# Patient Record
Sex: Female | Born: 1955 | Race: White | Hispanic: No | Marital: Single | Smoking: Never smoker
Health system: Southern US, Community
[De-identification: ages and names within clinical notes are randomized; demographics above are authoritative.]

## PROBLEM LIST (undated history)

## (undated) DIAGNOSIS — Z8041 Family history of malignant neoplasm of ovary: Secondary | ICD-10-CM

## (undated) DIAGNOSIS — E785 Hyperlipidemia, unspecified: Secondary | ICD-10-CM

## (undated) DIAGNOSIS — L409 Psoriasis, unspecified: Secondary | ICD-10-CM

## (undated) DIAGNOSIS — Z808 Family history of malignant neoplasm of other organs or systems: Secondary | ICD-10-CM

## (undated) DIAGNOSIS — Z8042 Family history of malignant neoplasm of prostate: Secondary | ICD-10-CM

## (undated) DIAGNOSIS — Z8051 Family history of malignant neoplasm of kidney: Secondary | ICD-10-CM

## (undated) DIAGNOSIS — I10 Essential (primary) hypertension: Secondary | ICD-10-CM

## (undated) HISTORY — DX: Hyperlipidemia, unspecified: E78.5

## (undated) HISTORY — DX: Family history of malignant neoplasm of other organs or systems: Z80.8

## (undated) HISTORY — DX: Family history of malignant neoplasm of prostate: Z80.42

## (undated) HISTORY — DX: Family history of malignant neoplasm of ovary: Z80.41

## (undated) HISTORY — DX: Family history of malignant neoplasm of kidney: Z80.51

## (undated) HISTORY — PX: BREAST CYST ASPIRATION: SHX578

## (undated) HISTORY — PX: TONSILLECTOMY AND ADENOIDECTOMY: SUR1326

## (undated) HISTORY — DX: Psoriasis, unspecified: L40.9

## (undated) HISTORY — PX: ABDOMINAL HYSTERECTOMY: SHX81

---
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
## 2014-01-18 ENCOUNTER — Ambulatory Visit: Payer: Self-pay | Admitting: Gastroenterology

## 2014-01-29 ENCOUNTER — Ambulatory Visit: Payer: Self-pay | Admitting: Family Medicine

## 2014-02-12 ENCOUNTER — Ambulatory Visit: Payer: Self-pay | Admitting: Family Medicine

## 2014-05-14 LAB — SURGICAL PATHOLOGY

## 2014-07-03 ENCOUNTER — Other Ambulatory Visit: Payer: Self-pay | Admitting: Family Medicine

## 2014-07-03 DIAGNOSIS — N63 Unspecified lump in unspecified breast: Secondary | ICD-10-CM

## 2014-08-13 ENCOUNTER — Ambulatory Visit
Admission: RE | Admit: 2014-08-13 | Discharge: 2014-08-13 | Disposition: A | Discharge status: Home / Self Care | Payer: Managed Care, Other (non HMO) | Source: Ambulatory Visit | Attending: Family Medicine | Admitting: Family Medicine

## 2014-08-13 ENCOUNTER — Other Ambulatory Visit: Payer: Self-pay | Admitting: Family Medicine

## 2014-08-13 DIAGNOSIS — N63 Unspecified lump in unspecified breast: Secondary | ICD-10-CM

## 2014-08-13 DIAGNOSIS — Z09 Encounter for follow-up examination after completed treatment for conditions other than malignant neoplasm: Secondary | ICD-10-CM | POA: Diagnosis present

## 2015-01-09 ENCOUNTER — Other Ambulatory Visit: Payer: Self-pay | Admitting: Family Medicine

## 2015-01-09 DIAGNOSIS — N63 Unspecified lump in unspecified breast: Secondary | ICD-10-CM

## 2015-01-31 ENCOUNTER — Ambulatory Visit
Admission: RE | Admit: 2015-01-31 | Discharge: 2015-01-31 | Disposition: A | Discharge status: Home / Self Care | Payer: Managed Care, Other (non HMO) | Source: Ambulatory Visit | Attending: Family Medicine | Admitting: Family Medicine

## 2015-01-31 DIAGNOSIS — N63 Unspecified lump in unspecified breast: Secondary | ICD-10-CM

## 2015-12-30 ENCOUNTER — Other Ambulatory Visit: Payer: Self-pay | Admitting: Family Medicine

## 2015-12-30 DIAGNOSIS — N63 Unspecified lump in unspecified breast: Secondary | ICD-10-CM

## 2015-12-30 DIAGNOSIS — R928 Other abnormal and inconclusive findings on diagnostic imaging of breast: Secondary | ICD-10-CM

## 2016-02-03 ENCOUNTER — Ambulatory Visit
Admission: RE | Admit: 2016-02-03 | Discharge: 2016-02-03 | Disposition: A | Discharge status: Home / Self Care | Payer: Managed Care, Other (non HMO) | Source: Ambulatory Visit | Attending: Family Medicine | Admitting: Family Medicine

## 2016-02-03 DIAGNOSIS — N63 Unspecified lump in unspecified breast: Secondary | ICD-10-CM

## 2016-02-03 DIAGNOSIS — R928 Other abnormal and inconclusive findings on diagnostic imaging of breast: Secondary | ICD-10-CM

## 2016-02-03 DIAGNOSIS — N6321 Unspecified lump in the left breast, upper outer quadrant: Secondary | ICD-10-CM | POA: Insufficient documentation

## 2016-12-29 ENCOUNTER — Other Ambulatory Visit: Payer: Self-pay | Admitting: Family Medicine

## 2016-12-29 DIAGNOSIS — Z1231 Encounter for screening mammogram for malignant neoplasm of breast: Secondary | ICD-10-CM

## 2017-02-03 ENCOUNTER — Ambulatory Visit
Admission: RE | Admit: 2017-02-03 | Discharge: 2017-02-03 | Disposition: A | Discharge status: Home / Self Care | Payer: Managed Care, Other (non HMO) | Source: Ambulatory Visit | Attending: Family Medicine | Admitting: Family Medicine

## 2017-02-03 DIAGNOSIS — Z1231 Encounter for screening mammogram for malignant neoplasm of breast: Secondary | ICD-10-CM | POA: Diagnosis not present

## 2017-02-11 ENCOUNTER — Other Ambulatory Visit: Payer: Self-pay | Admitting: Family Medicine

## 2017-02-11 DIAGNOSIS — N6320 Unspecified lump in the left breast, unspecified quadrant: Secondary | ICD-10-CM

## 2017-02-11 DIAGNOSIS — R928 Other abnormal and inconclusive findings on diagnostic imaging of breast: Secondary | ICD-10-CM

## 2017-02-18 ENCOUNTER — Ambulatory Visit
Admission: RE | Admit: 2017-02-18 | Discharge: 2017-02-18 | Disposition: A | Discharge status: Home / Self Care | Payer: Managed Care, Other (non HMO) | Source: Ambulatory Visit | Attending: Family Medicine | Admitting: Family Medicine

## 2017-02-18 DIAGNOSIS — R928 Other abnormal and inconclusive findings on diagnostic imaging of breast: Secondary | ICD-10-CM | POA: Diagnosis present

## 2017-02-18 DIAGNOSIS — N6320 Unspecified lump in the left breast, unspecified quadrant: Secondary | ICD-10-CM

## 2018-01-26 ENCOUNTER — Other Ambulatory Visit: Payer: Self-pay | Admitting: Family Medicine

## 2018-01-26 DIAGNOSIS — Z1231 Encounter for screening mammogram for malignant neoplasm of breast: Secondary | ICD-10-CM

## 2018-02-15 ENCOUNTER — Ambulatory Visit
Admission: RE | Admit: 2018-02-15 | Discharge: 2018-02-15 | Disposition: A | Discharge status: Home / Self Care | Payer: Managed Care, Other (non HMO) | Source: Ambulatory Visit | Attending: Family Medicine | Admitting: Family Medicine

## 2018-02-15 DIAGNOSIS — Z1231 Encounter for screening mammogram for malignant neoplasm of breast: Secondary | ICD-10-CM | POA: Diagnosis not present

## 2018-09-02 ENCOUNTER — Encounter: Payer: Self-pay | Admitting: Family Medicine

## 2018-09-02 ENCOUNTER — Other Ambulatory Visit: Payer: Self-pay

## 2018-09-02 ENCOUNTER — Ambulatory Visit (INDEPENDENT_AMBULATORY_CARE_PROVIDER_SITE_OTHER): Payer: PRIVATE HEALTH INSURANCE | Admitting: Family Medicine

## 2018-09-02 VITALS — BP 149/92 | HR 69 | Temp 98.5°F | Ht 60.5 in | Wt 151.0 lb

## 2018-09-02 DIAGNOSIS — M0800 Unspecified juvenile rheumatoid arthritis of unspecified site: Secondary | ICD-10-CM

## 2018-09-02 DIAGNOSIS — Z7689 Persons encountering health services in other specified circumstances: Secondary | ICD-10-CM | POA: Diagnosis not present

## 2018-09-02 DIAGNOSIS — Z8719 Personal history of other diseases of the digestive system: Secondary | ICD-10-CM | POA: Diagnosis not present

## 2018-09-02 DIAGNOSIS — L409 Psoriasis, unspecified: Secondary | ICD-10-CM

## 2018-09-02 MED ORDER — DICLOFENAC SODIUM 1 % TD GEL: 2.0000 g | Freq: Four times a day (QID) | TRANSDERMAL | 1 refills | Status: DC

## 2018-09-02 NOTE — Progress Notes (Signed)
BP (!) 149/92   Pulse 69   Temp 98.5 F (36.9 C) (Oral)   Ht 5' 0.5" (1.537 m)   Wt 151 lb (68.5 kg)   SpO2 99%   BMI 29.00 kg/m    Subjective:    Patient ID: Cynthia Nelson, female    DOB: 1955/11/09, 63 y.o.   MRN: [id number]  HPI: Cynthia Nelson is a 63 y.o. female  Chief Complaint  Patient presents with  . Establish Care    no concerns   Patient presents today to establish care. No concerns today.   Last CPE was back in January 2020.   Followed by Dermatology for psoriasis, currently under good control with otezla, clobetasol, and fluocinolone scalp oil prn.   Hx of juvenile RA, has been dealing with the joint pains (worst in hands) her entire life and notes they are tolerable for her and not worsening. Takes tylenol prn. Cannot take NSAIDs due to hx of duodenal ulcer. Has never seen Rheumatologist for this per patient. Denies joint redness, but does have occasional swelling.    Relevant past medical, surgical, family and social history reviewed and updated as indicated. Interim medical history since our last visit reviewed. Allergies and medications reviewed and updated.  Review of Systems  Per HPI unless specifically indicated above     Objective:    BP (!) 149/92   Pulse 69   Temp 98.5 F (36.9 C) (Oral)   Ht 5' 0.5" (1.537 m)   Wt 151 lb (68.5 kg)   SpO2 99%   BMI 29.00 kg/m   Wt Readings from Last 3 Encounters:  09/02/18 151 lb (68.5 kg)    Physical Exam Vitals signs and nursing note reviewed.  Constitutional:      Appearance: Normal appearance. She is not ill-appearing.  HENT:     Head: Atraumatic.  Eyes:     Extraocular Movements: Extraocular movements intact.     Conjunctiva/sclera: Conjunctivae normal.  Neck:     Musculoskeletal: Normal range of motion and neck supple.  Cardiovascular:     Rate and Rhythm: Normal rate and regular rhythm.     Heart sounds: Normal heart sounds.  Pulmonary:     Effort: Pulmonary effort is normal.   Breath sounds: Normal breath sounds.  Musculoskeletal: Normal range of motion.        General: No deformity.  Skin:    General: Skin is warm and dry.  Neurological:     Mental Status: She is alert and oriented to person, place, and time.  Psychiatric:        Mood and Affect: Mood normal.        Thought Content: Thought content normal.        Judgment: Judgment normal.     No results found for this or any previous visit.    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 Declines Rheumatology referral today as the pain is tolerable and unchanged from lifelong course. WIll start diclofenac gel prn for comfort in addition to prn tylenol.       Relevant Medications   Apremilast (OTEZLA) 30 MG TABS   Psoriasis - Primary    Followed by Dermatology, under good control. Continue current regimen per their recommendation        Other   History of duodenal ulcer  No current sxs. Continue avoidance of NSAIDs and call with GI issues as reviewed       Other Visit Diagnoses    Encounter to establish care           Follow up plan: Return in about 5 months (around 02/02/2019) for CPE.

## 2018-09-09 DIAGNOSIS — L409 Psoriasis, unspecified: Secondary | ICD-10-CM | POA: Insufficient documentation

## 2018-09-09 NOTE — Assessment & Plan Note (Signed)
Followed by Dermatology, under good control. Continue current regimen per their recommendation

## 2018-09-09 NOTE — Assessment & Plan Note (Signed)
With stable b/l joint pains, worst in hands per patient. Declines Rheumatology referral today as the pain is tolerable and unchanged from lifelong course. WIll start diclofenac gel prn for comfort in addition to prn tylenol.

## 2018-09-09 NOTE — Assessment & Plan Note (Signed)
No current sxs. Continue avoidance of NSAIDs and call with GI issues as reviewed

## 2019-02-01 ENCOUNTER — Encounter: Payer: PRIVATE HEALTH INSURANCE | Admitting: Family Medicine

## 2019-04-11 ENCOUNTER — Other Ambulatory Visit: Payer: Self-pay | Admitting: Dermatology

## 2019-04-25 ENCOUNTER — Telehealth: Payer: Self-pay | Admitting: Family Medicine

## 2019-04-25 DIAGNOSIS — Z1231 Encounter for screening mammogram for malignant neoplasm of breast: Secondary | ICD-10-CM

## 2019-04-25 NOTE — Telephone Encounter (Signed)
Copied from [place] [id number]. Topic: General - Inquiry >> Apr 25, 2019  1:07 PM Alease Frame wrote: Reason for CRM: pt is requesting a referral for a mammogram. Please advise

## 2019-04-27 NOTE — Telephone Encounter (Signed)
Order placed

## 2019-04-27 NOTE — Addendum Note (Signed)
Addended by: Merrie Roof E on: 04/27/2019 02:04 PM   Modules accepted: Orders

## 2019-04-27 NOTE — Telephone Encounter (Signed)
Pt has an appt scheduled for tomorrow.  

## 2019-04-28 ENCOUNTER — Encounter: Payer: PRIVATE HEALTH INSURANCE | Admitting: Family Medicine

## 2019-04-28 NOTE — Telephone Encounter (Signed)
Patient called to inquire about referral for a mammogram to [facility] but was informed that per yesterdays message order was placed.

## 2019-05-05 ENCOUNTER — Other Ambulatory Visit: Payer: Self-pay | Admitting: Family Medicine

## 2019-05-05 ENCOUNTER — Telehealth: Payer: Self-pay | Admitting: Family Medicine

## 2019-05-05 NOTE — Telephone Encounter (Signed)
Needs appt to perform in person exam and order appropriate imaging. She is overdue for CPE and this would be an appropriate next step to get this sorted

## 2019-05-05 NOTE — Telephone Encounter (Signed)
Order shows cancelled due to PT HAS LUMO IN BREAST, NEEDS DIAG BILAT MAMMO AND RT/LT BR LTD [place] ORDERS. Please advise.

## 2019-05-05 NOTE — Telephone Encounter (Signed)
Contacted Cynthia Nelson regarding mammogram order and was informed that patient told the scheduler at [facility] that she had a mass in her breast and needed a mammogram.  Pt told the scheduler that her provider was aware and that she was advised to call to schedule an appointment.  The scheduler at [facility] cancelled the order and instructed pt to contact pcp regarding mass and to request a diagnostic mammogram.  Contacted pt to explain why provider ordered a screening mammogram and explained that she must inform pcp if she is has a new issue. Pt stated that she just wanted a mammogram and didn't care what kind of mammogram it was.  Pt states she is having problems with her left breast and that it started a couple of weeks ago.  Pt states that she has cysts sometimes and also has dense breast and that the mass comes and goes.  Please advise next steps.

## 2019-05-05 NOTE — Telephone Encounter (Signed)
Patient called back to say that [facility] did not receive the referral and she can not schedule an appointment until they get the referral. Please advise.Marland KitchenMarland KitchenPatient can be reached at Ph# [phone]

## 2019-05-05 NOTE — Telephone Encounter (Signed)
Message from last week 4/9 with patient calling stating [facility] didn't get the order was never routed to me so I was unaware of this. Message from today received that I just needed to change the order I had placed to diagnostic, but per my record review she's had normal SCREENING mammograms in the past with most recent one being 01/2018 recommending screening mammogram in one year. Pt was established 08/2018 and had no breast concerns. When she called to request mammogram order a screening mammogram order was appropriately placed and subsequently cancelled without my knowledge by [facility]. Unclear why they are requesting orders to be changed to diagnostic based on my clinical knowledge of this patient. Please advise.

## 2019-05-05 NOTE — Telephone Encounter (Signed)
Patient scheduled for CPE on Monday, May 10th at [facility].  Patient unable to make appointment sooner due to training for new job from 9am-6pm.

## 2019-05-10 NOTE — Telephone Encounter (Signed)
Pt scheduled for appt on May 29, 2019.

## 2019-05-29 ENCOUNTER — Other Ambulatory Visit: Payer: Self-pay | Admitting: Family Medicine

## 2019-05-29 ENCOUNTER — Ambulatory Visit
Admission: RE | Admit: 2019-05-29 | Discharge: 2019-05-29 | Disposition: A | Discharge status: Home / Self Care | Payer: No Typology Code available for payment source | Source: Ambulatory Visit | Attending: Family Medicine | Admitting: Family Medicine

## 2019-05-29 ENCOUNTER — Ambulatory Visit (INDEPENDENT_AMBULATORY_CARE_PROVIDER_SITE_OTHER): Payer: No Typology Code available for payment source | Admitting: Family Medicine

## 2019-05-29 ENCOUNTER — Encounter: Payer: Self-pay | Admitting: Family Medicine

## 2019-05-29 ENCOUNTER — Ambulatory Visit (INDEPENDENT_AMBULATORY_CARE_PROVIDER_SITE_OTHER): Payer: No Typology Code available for payment source | Admitting: Dermatology

## 2019-05-29 ENCOUNTER — Other Ambulatory Visit: Payer: Self-pay

## 2019-05-29 VITALS — BP 148/110 | HR 94 | Temp 97.9°F | Ht 61.46 in | Wt 148.5 lb

## 2019-05-29 DIAGNOSIS — N6320 Unspecified lump in the left breast, unspecified quadrant: Secondary | ICD-10-CM

## 2019-05-29 DIAGNOSIS — N6321 Unspecified lump in the left breast, upper outer quadrant: Secondary | ICD-10-CM

## 2019-05-29 DIAGNOSIS — M0800 Unspecified juvenile rheumatoid arthritis of unspecified site: Secondary | ICD-10-CM | POA: Diagnosis not present

## 2019-05-29 DIAGNOSIS — Z0000 Encounter for general adult medical examination without abnormal findings: Secondary | ICD-10-CM

## 2019-05-29 DIAGNOSIS — I10 Essential (primary) hypertension: Secondary | ICD-10-CM

## 2019-05-29 DIAGNOSIS — Z1159 Encounter for screening for other viral diseases: Secondary | ICD-10-CM

## 2019-05-29 DIAGNOSIS — M08041 Unspecified juvenile rheumatoid arthritis, right hand: Secondary | ICD-10-CM

## 2019-05-29 DIAGNOSIS — Z136 Encounter for screening for cardiovascular disorders: Secondary | ICD-10-CM

## 2019-05-29 DIAGNOSIS — L409 Psoriasis, unspecified: Secondary | ICD-10-CM | POA: Diagnosis not present

## 2019-05-29 DIAGNOSIS — Z114 Encounter for screening for human immunodeficiency virus [HIV]: Secondary | ICD-10-CM | POA: Diagnosis not present

## 2019-05-29 DIAGNOSIS — M08042 Unspecified juvenile rheumatoid arthritis, left hand: Secondary | ICD-10-CM

## 2019-05-29 DIAGNOSIS — R928 Other abnormal and inconclusive findings on diagnostic imaging of breast: Secondary | ICD-10-CM

## 2019-05-29 NOTE — Assessment & Plan Note (Signed)
Stable and WNL, continue current regimen 

## 2019-05-29 NOTE — Assessment & Plan Note (Signed)
Stable and well controlled, continue current regimen 

## 2019-05-29 NOTE — Progress Notes (Signed)
   Follow-Up Visit   Subjective  Cynthia Nelson is a 64 y.o. female who presents for the following: Psoriasis (of legs and scalp - Otezla 30mg  bid. No side effects from [place].).  History of juvenile rheumatoid arthritis diagnosed as a child.  The [name] has helped her joints also.  The following portions of the chart were reviewed this encounter and updated as appropriate:  Tobacco  Allergies  Meds  Problems  Med Hx  Surg Hx  Fam Hx      Review of Systems:  No other skin or systemic complaints except as noted in HPI or Assessment and Plan.  Objective  Well appearing patient in no apparent distress; mood and affect are within normal limits.  A focused examination was performed including arms, legs, scalp. Relevant physical exam findings are noted in the Assessment and Plan.  Objective  Scalp and legs: Lower legs and knees are clear. Slight scale of left palm. Occipital scalp is clear.  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  Psoriasis severe on Otezla well controlled with minimal persistent areas. Scalp and legs  Continue Otezla 30mg  1 po bid. Patient assistance paperwork given to [place] today.  May restart Fluocinonide and Temovate if needed for flares.  Return in about 6 months (around 11/29/2019).  I, Ashok Cordia, CMA, am acting as scribe for Sarina Ser, MD .  Documentation: I have reviewed the above documentation for accuracy and completeness, and I agree with the above.  Sarina Ser, MD

## 2019-05-29 NOTE — Assessment & Plan Note (Signed)
Elevated consistently currently but wishes to avoid medication at this point and instead increase exercise and work on stress control as she feels very stressed currently. Continue home monitoring, call if readings don't improve

## 2019-05-29 NOTE — Progress Notes (Signed)
BP (!) 148/110   Pulse 94   Temp 97.9 F (36.6 C)   Ht 5' 1.46" (1.561 m)   Wt 148 lb 8 oz (67.4 kg)   SpO2 97%   BMI 27.64 kg/m    Subjective:    Patient ID: Cynthia Nelson, female    DOB: 1956-01-10, 64 y.o.   MRN: [id number]  HPI: Cynthia Nelson is a 64 y.o. female presenting on 05/29/2019 for comprehensive medical examination. Current medical complaints include:see below  Left breast lump around 1-2 o clock, has hx of cysts in breasts always benign in past. Denies noted weight loss, fhx of breast cancer, night sweats, fatigue, nipple discharge.   HTN - under good control when she's exercising consistently but since January had not been. Wanting to avoid medication. Home readings have been 140s-150s/90s range recently. Has started back walking about 3 miles per day now that it's warming up outside. Denies CP, SOB, HAs, dizziness.   She currently lives with: Menopausal Symptoms: no  Depression Screen done today and results listed below:  Depression screen [facility] 2/9 05/29/2019 09/02/2018  Decreased Interest 0 0  Down, Depressed, Hopeless 0 0  PHQ - 2 Score 0 0  Altered sleeping - 0  Tired, decreased energy - 0  Change in appetite - 0  Feeling bad or failure about yourself  - 0  Trouble concentrating - 0  Moving slowly or fidgety/restless - 0  Suicidal thoughts - 0  PHQ-9 Score - 0  Difficult doing work/chores - Not difficult at all    The patient does not have a history of falls. I did complete a risk assessment for falls. A plan of care for falls was documented.   Past Medical History:  Past Medical History:  Diagnosis Date  . Psoriasis     Surgical History:  Past Surgical History:  Procedure Laterality Date  . ABDOMINAL HYSTERECTOMY     partial- still has ovaries  . BREAST CYST ASPIRATION Right   . TONSILLECTOMY AND ADENOIDECTOMY      Medications:  Current Outpatient Medications on File Prior to Visit  Medication Sig  . diclofenac sodium (VOLTAREN) 1 % GEL  Apply 2 g topically 4 (four) times daily. (Patient not taking: Reported on 05/29/2019)  . OTEZLA 30 MG TABS TAKE 1 TABLET BY MOUTH TWICE DAILY   No current facility-administered medications on file prior to visit.    Allergies:  Allergies  Allergen Reactions  . Codeine Nausea And Vomiting    Social History:  Social History   Socioeconomic History  . Marital status: Single    Spouse name: Not on file  . Number of children: Not on file  . Years of education: Not on file  . Highest education level: Not on file  Occupational History  . Not on file  Tobacco Use  . Smoking status: Never Smoker  . Smokeless tobacco: Never Used  Substance and Sexual Activity  . Alcohol use: Never  . Drug use: Never  . Sexual activity: Not on file  Other Topics Concern  . Not on file  Social History Narrative  . Not on file   Social Determinants of Health   Financial Resource Strain:   . Difficulty of Paying Living Expenses:   Food Insecurity:   . Worried About Charity fundraiser in the Last Year:   . Arboriculturist in the Last Year:   Transportation Needs:   . Film/video editor (Medical):   Marland Kitchen Lack of  Transportation (Non-Medical):   Physical Activity:   . Days of Exercise per Week:   . Minutes of Exercise per Session:   Stress:   . Feeling of Stress :   Social Connections:   . Frequency of Communication with Friends and Family:   . Frequency of Social Gatherings with Friends and Family:   . Attends Religious Services:   . Active Member of Clubs or Organizations:   . Attends Archivist Meetings:   Marland Kitchen Marital Status:   Intimate Partner Violence:   . Fear of Current or Ex-Partner:   . Emotionally Abused:   Marland Kitchen Physically Abused:   . Sexually Abused:    Social History   Tobacco Use  Smoking Status Never Smoker  Smokeless Tobacco Never Used   Social History   Substance and Sexual Activity  Alcohol Use Never    Family History:  Family History  Problem  Relation Age of Onset  . Lung cancer Mother 62       smoker  . Ovarian cancer Sister 35  . Emphysema Father   . COPD Father   . Cancer Brother        kidney  . Cancer Maternal Grandmother   . Cancer Maternal Grandfather   . Heart disease Paternal Grandmother   . Hypertension Paternal Grandmother   . Heart disease Brother   . Breast cancer Neg Hx     Past medical history, surgical history, medications, allergies, family history and social history reviewed with patient today and changes made to appropriate areas of the chart.   Review of Systems - General ROS: negative Psychological ROS: negative Ophthalmic ROS: negative ENT ROS: negative Allergy and Immunology ROS: negative Hematological and Lymphatic ROS: negative Endocrine ROS: negative Breast ROS: positive for - new or changing breast lumps Respiratory ROS: no cough, shortness of breath, or wheezing Cardiovascular ROS: no chest pain or dyspnea on exertion Gastrointestinal ROS: no abdominal pain, change in bowel habits, or black or bloody stools Genito-Urinary ROS: no dysuria, trouble voiding, or hematuria Musculoskeletal ROS: negative Neurological ROS: no TIA or stroke symptoms Dermatological ROS: negative All other ROS negative except what is listed above and in the HPI.      Objective:    BP (!) 148/110   Pulse 94   Temp 97.9 F (36.6 C)   Ht 5' 1.46" (1.561 m)   Wt 148 lb 8 oz (67.4 kg)   SpO2 97%   BMI 27.64 kg/m   Wt Readings from Last 3 Encounters:  05/29/19 148 lb 8 oz (67.4 kg)  09/02/18 151 lb (68.5 kg)    Physical Exam Vitals and nursing note reviewed.  Constitutional:      General: She is not in acute distress.    Appearance: She is well-developed.  HENT:     Head: Atraumatic.     Right Ear: External ear normal.     Left Ear: External ear normal.     Nose: Nose normal.     Mouth/Throat:     Pharynx: No oropharyngeal exudate.  Eyes:     General: No scleral icterus.    Conjunctiva/sclera:  Conjunctivae normal.     Pupils: Pupils are equal, round, and reactive to light.  Neck:     Thyroid: No thyromegaly.  Cardiovascular:     Rate and Rhythm: Normal rate and regular rhythm.     Heart sounds: Normal heart sounds.  Pulmonary:     Effort: Pulmonary effort is normal. No respiratory distress.  Breath sounds: Normal breath sounds.  Chest:    Abdominal:     General: Bowel sounds are normal.     Palpations: Abdomen is soft. There is no mass.     Tenderness: There is no abdominal tenderness.  Genitourinary:    Comments:  Declines GU exam Musculoskeletal:        General: No tenderness. Normal range of motion.     Cervical back: Normal range of motion and neck supple.  Lymphadenopathy:     Cervical: No cervical adenopathy.  Skin:    General: Skin is warm and dry.     Findings: No rash.  Neurological:     Mental Status: She is alert and oriented to person, place, and time.     Cranial Nerves: No cranial nerve deficit.  Psychiatric:        Behavior: Behavior normal.     No results found for this or any previous visit.    Assessment & Plan:   Problem List Items Addressed This Visit      Cardiovascular and Mediastinum   Essential hypertension - Primary    Elevated consistently currently but wishes to avoid medication at this point and instead increase exercise and work on stress control as she feels very stressed currently. 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    Relevant Orders   MM DIAG BREAST TOMO BILATERAL   US BREAST LTD UNI LEFT INC AXILLA   Encounter for screening for HIV       Relevant Orders   HIV Antibody (routine testing w rflx)   Need for hepatitis C screening test       Relevant Orders   Hepatitis C antibody   Screening for cardiovascular condition       Relevant Orders   Lipid Panel w/o Chol/HDL Ratio       Follow up plan: Return in about 6 months (around 11/29/2019) for HTN f/u.   LABORATORY TESTING:  - Pap smear: not applicable  IMMUNIZATIONS:   - Tdap: Tetanus vaccination status reviewed: last tetanus booster within 10 years. - Influenza: Up to date   SCREENING: -Mammogram: scheduled for today  - Colonoscopy: scheduled in several months   PATIENT COUNSELING:   Advised to take 1 mg of folate supplement per day if capable of pregnancy.   Sexuality: Discussed sexually transmitted diseases, partner selection, use of condoms, avoidance of unintended pregnancy  and contraceptive alternatives.   Advised to avoid cigarette smoking.  I discussed with the patient that most people either abstain from alcohol or drink within safe limits (<=14/week and <=4 drinks/occasion for males, <=7/weeks and <= 3 drinks/occasion for females) and that the risk for alcohol disorders and other health effects rises proportionally with the number of drinks per week and how often a drinker exceeds daily limits.  Discussed cessation/primary prevention of drug use and availability of  treatment for abuse.   Diet: Encouraged to adjust caloric intake to maintain  or achieve ideal body weight, to reduce intake of dietary saturated fat and total fat, to limit sodium intake by avoiding high sodium foods and not adding table salt, and to maintain adequate dietary potassium and calcium preferably from fresh fruits, vegetables, and low-fat dairy products.    stressed the importance of regular exercise  Injury prevention: Discussed safety belts,  safety helmets, smoke detector, smoking near bedding or upholstery.   Dental health: Discussed importance of regular tooth brushing, flossing, and dental visits.    NEXT PREVENTATIVE PHYSICAL DUE IN 1 YEAR. Return in about 6 months (around 11/29/2019) for HTN f/u.

## 2019-05-30 ENCOUNTER — Telehealth: Payer: Self-pay | Admitting: Family Medicine

## 2019-05-30 ENCOUNTER — Encounter: Payer: Self-pay | Admitting: Dermatology

## 2019-05-30 LAB — CBC WITH DIFFERENTIAL/PLATELET
Basophils Absolute: 0.1 10*3/uL (ref 0.0–0.2)
Basos: 1 %
EOS (ABSOLUTE): 0.4 10*3/uL (ref 0.0–0.4)
Eos: 6 %
Hematocrit: 46.1 % (ref 34.0–46.6)
Hemoglobin: 16.4 g/dL — ABNORMAL HIGH (ref 11.1–15.9)
Immature Grans (Abs): 0 10*3/uL (ref 0.0–0.1)
Immature Granulocytes: 0 %
Lymphocytes Absolute: 1.9 10*3/uL (ref 0.7–3.1)
Lymphs: 31 %
MCH: 33.7 pg — ABNORMAL HIGH (ref 26.6–33.0)
MCHC: 35.6 g/dL (ref 31.5–35.7)
MCV: 95 fL (ref 79–97)
Monocytes Absolute: 0.5 10*3/uL (ref 0.1–0.9)
Monocytes: 8 %
Neutrophils Absolute: 3.3 10*3/uL (ref 1.4–7.0)
Neutrophils: 54 %
Platelets: 284 10*3/uL (ref 150–450)
RBC: 4.86 x10E6/uL (ref 3.77–5.28)
RDW: 11.9 % (ref 11.7–15.4)
WBC: 6.1 10*3/uL (ref 3.4–10.8)

## 2019-05-30 LAB — COMPREHENSIVE METABOLIC PANEL
ALT: 18 IU/L (ref 0–32)
AST: 25 IU/L (ref 0–40)
Albumin/Globulin Ratio: 1.8 (ref 1.2–2.2)
Albumin: 4.4 g/dL (ref 3.8–4.8)
Alkaline Phosphatase: 89 IU/L (ref 39–117)
BUN/Creatinine Ratio: 15 (ref 12–28)
BUN: 13 mg/dL (ref 8–27)
Bilirubin Total: 0.4 mg/dL (ref 0.0–1.2)
CO2: 26 mmol/L (ref 20–29)
Calcium: 10.7 mg/dL — ABNORMAL HIGH (ref 8.7–10.3)
Chloride: 99 mmol/L (ref 96–106)
Creatinine, Ser: 0.84 mg/dL (ref 0.57–1.00)
GFR calc Af Amer: 86 mL/min/{1.73_m2} (ref >59)
GFR calc non Af Amer: 75 mL/min/{1.73_m2} (ref >59)
Globulin, Total: 2.4 g/dL (ref 1.5–4.5)
Glucose: 80 mg/dL (ref 65–99)
Potassium: 4.5 mmol/L (ref 3.5–5.2)
Sodium: 139 mmol/L (ref 134–144)
Total Protein: 6.8 g/dL (ref 6.0–8.5)

## 2019-05-30 LAB — HIV ANTIBODY (ROUTINE TESTING W REFLEX): HIV Screen 4th Generation wRfx: NONREACTIVE

## 2019-05-30 LAB — LIPID PANEL W/O CHOL/HDL RATIO
Cholesterol, Total: 274 mg/dL — ABNORMAL HIGH (ref 100–199)
HDL: 69 mg/dL (ref >39)
LDL Chol Calc (NIH): 176 mg/dL — ABNORMAL HIGH (ref 0–99)
Triglycerides: 158 mg/dL — ABNORMAL HIGH (ref 0–149)
VLDL Cholesterol Cal: 29 mg/dL (ref 5–40)

## 2019-05-30 LAB — HEPATITIS C ANTIBODY: Hep C Virus Ab: <0.1 s/co ratio (ref 0.0–0.9)

## 2019-05-30 LAB — TSH: TSH: 1.09 u[IU]/mL (ref 0.450–4.500)

## 2019-05-30 NOTE — Telephone Encounter (Signed)
Copied from [place] [id number]. Topic: General - Call Back - No Documentation >> May 29, 2019  5:38 PM Erick Blinks wrote: Best contact: [phone]  Mammogram needed >> May 30, 2019  8:27 AM Stark Klein wrote: There was an order placed for an ultrasound yesterday but the pt stated that she also needs a mammogram. Please advise.  >> May 30, 2019  8:26 AM Scherrie Gerlach wrote: Pt states she now needs a biopsy after the mammogram she had yesterday.  Pt states she was to let Apolonio Schneiders know so she could put in order.

## 2019-05-30 NOTE — Telephone Encounter (Signed)
Spoke with Roselyn Reef at [facility] has the orders needed, she will call patient and get her scheduled.

## 2019-05-31 ENCOUNTER — Telehealth: Payer: Self-pay | Admitting: Family Medicine

## 2019-05-31 LAB — MICROSCOPIC EXAMINATION
Bacteria, UA: NONE SEEN
RBC: NONE SEEN /hpf (ref 0–2)

## 2019-05-31 LAB — URINE CULTURE, REFLEX

## 2019-05-31 LAB — UA/M W/RFLX CULTURE, ROUTINE
Bilirubin, UA: NEGATIVE
Glucose, UA: NEGATIVE
Ketones, UA: NEGATIVE
Nitrite, UA: NEGATIVE
Protein,UA: NEGATIVE
RBC, UA: NEGATIVE
Specific Gravity, UA: 1.015 (ref 1.005–1.030)
Urobilinogen, Ur: 0.2 mg/dL (ref 0.2–1.0)
pH, UA: 8.5 — ABNORMAL HIGH (ref 5.0–7.5)

## 2019-05-31 NOTE — Telephone Encounter (Signed)
Called to discuss lab results, left VM to return call

## 2019-05-31 NOTE — Telephone Encounter (Signed)
Pt called back, states that she is working and a VM can be left with results.

## 2019-06-01 ENCOUNTER — Ambulatory Visit
Admission: RE | Admit: 2019-06-01 | Discharge: 2019-06-01 | Disposition: A | Discharge status: Home / Self Care | Payer: PRIVATE HEALTH INSURANCE | Source: Ambulatory Visit | Attending: Family Medicine | Admitting: Family Medicine

## 2019-06-01 DIAGNOSIS — N6320 Unspecified lump in the left breast, unspecified quadrant: Secondary | ICD-10-CM

## 2019-06-01 DIAGNOSIS — R928 Other abnormal and inconclusive findings on diagnostic imaging of breast: Secondary | ICD-10-CM | POA: Diagnosis present

## 2019-06-01 HISTORY — PX: BREAST BIOPSY: SHX20

## 2019-06-02 ENCOUNTER — Telehealth: Payer: Self-pay | Admitting: Family Medicine

## 2019-06-02 NOTE — Telephone Encounter (Signed)
Patient is calling back for her lab results. Please advise CB- [phone]

## 2019-06-02 NOTE — Telephone Encounter (Signed)
Routing to provider for results.

## 2019-06-02 NOTE — Telephone Encounter (Signed)
Patient is calling back to let PCP know that she would like to start medication to help lower her cholesterol.

## 2019-06-02 NOTE — Telephone Encounter (Signed)
Please see previous telephone encounter. Detailed VM of results sent this morning. OK to read them back to her again.

## 2019-06-02 NOTE — Telephone Encounter (Signed)
Called and left a detailed message for patient.  

## 2019-06-02 NOTE — Telephone Encounter (Signed)
Please let her know her labs came back stable other than her cholesterol being above goal. If she would like to start on some cholesterol medication that would be reasonable to help with this, otherwise good diet and exercise, fish oil, garlic, etc can be good natural ways to help.

## 2019-06-04 MED ORDER — ATORVASTATIN CALCIUM 20 MG PO TABS: 20.0000 mg | ORAL_TABLET | Freq: Every day | ORAL | 1 refills | Status: DC

## 2019-06-04 NOTE — Telephone Encounter (Signed)
Rx sent 

## 2019-06-06 DIAGNOSIS — C50919 Malignant neoplasm of unspecified site of unspecified female breast: Secondary | ICD-10-CM

## 2019-06-07 ENCOUNTER — Other Ambulatory Visit: Payer: Self-pay | Admitting: Pathology

## 2019-06-07 NOTE — Progress Notes (Signed)
Initiated patient navigation.  She is scheduled for initial med?on and Surgical consult on 06/08/19.

## 2019-06-08 ENCOUNTER — Ambulatory Visit (INDEPENDENT_AMBULATORY_CARE_PROVIDER_SITE_OTHER): Payer: No Typology Code available for payment source | Admitting: Surgery

## 2019-06-08 ENCOUNTER — Ambulatory Visit: Payer: Self-pay | Admitting: Surgery

## 2019-06-08 ENCOUNTER — Other Ambulatory Visit: Payer: Self-pay

## 2019-06-08 ENCOUNTER — Encounter: Payer: Self-pay | Admitting: Oncology

## 2019-06-08 ENCOUNTER — Inpatient Hospital Stay: Payer: No Typology Code available for payment source | Attending: Oncology | Admitting: Oncology

## 2019-06-08 ENCOUNTER — Other Ambulatory Visit: Payer: Self-pay | Admitting: Surgery

## 2019-06-08 ENCOUNTER — Encounter: Payer: Self-pay | Admitting: Surgery

## 2019-06-08 ENCOUNTER — Inpatient Hospital Stay: Payer: No Typology Code available for payment source

## 2019-06-08 VITALS — BP 142/93 | HR 174 | Temp 94.6°F | Ht 62.0 in | Wt 147.4 lb

## 2019-06-08 VITALS — BP 161/96 | HR 87 | Temp 97.4°F | Resp 18 | Ht 62.0 in | Wt 148.0 lb

## 2019-06-08 DIAGNOSIS — C50412 Malignant neoplasm of upper-outer quadrant of left female breast: Secondary | ICD-10-CM | POA: Diagnosis present

## 2019-06-08 DIAGNOSIS — C50919 Malignant neoplasm of unspecified site of unspecified female breast: Secondary | ICD-10-CM

## 2019-06-08 DIAGNOSIS — Z170 Estrogen receptor positive status [ER+]: Secondary | ICD-10-CM | POA: Insufficient documentation

## 2019-06-08 DIAGNOSIS — Z79899 Other long term (current) drug therapy: Secondary | ICD-10-CM | POA: Diagnosis not present

## 2019-06-08 DIAGNOSIS — Z8051 Family history of malignant neoplasm of kidney: Secondary | ICD-10-CM | POA: Insufficient documentation

## 2019-06-08 DIAGNOSIS — C50912 Malignant neoplasm of unspecified site of left female breast: Secondary | ICD-10-CM

## 2019-06-08 DIAGNOSIS — D751 Secondary polycythemia: Secondary | ICD-10-CM | POA: Insufficient documentation

## 2019-06-08 DIAGNOSIS — Z809 Family history of malignant neoplasm, unspecified: Secondary | ICD-10-CM | POA: Diagnosis not present

## 2019-06-08 DIAGNOSIS — Z8041 Family history of malignant neoplasm of ovary: Secondary | ICD-10-CM | POA: Diagnosis not present

## 2019-06-08 LAB — CBC WITH DIFFERENTIAL/PLATELET
Abs Immature Granulocytes: 0.02 10*3/uL (ref 0.00–0.07)
Basophils Absolute: 0.1 10*3/uL (ref 0.0–0.1)
Basophils Relative: 1 %
Eosinophils Absolute: 0.5 10*3/uL (ref 0.0–0.5)
Eosinophils Relative: 6 %
HCT: 48.2 % — ABNORMAL HIGH (ref 36.0–46.0)
Hemoglobin: 16.8 g/dL — ABNORMAL HIGH (ref 12.0–15.0)
Immature Granulocytes: 0 %
Lymphocytes Relative: 29 %
Lymphs Abs: 2.4 10*3/uL (ref 0.7–4.0)
MCH: 32.8 pg (ref 26.0–34.0)
MCHC: 34.9 g/dL (ref 30.0–36.0)
MCV: 94.1 fL (ref 80.0–100.0)
Monocytes Absolute: 0.7 10*3/uL (ref 0.1–1.0)
Monocytes Relative: 8 %
Neutro Abs: 4.6 10*3/uL (ref 1.7–7.7)
Neutrophils Relative %: 56 %
Platelets: 315 10*3/uL (ref 150–400)
RBC: 5.12 MIL/uL — ABNORMAL HIGH (ref 3.87–5.11)
RDW: 12.1 % (ref 11.5–15.5)
WBC: 8.3 10*3/uL (ref 4.0–10.5)
nRBC: 0 % (ref 0.0–0.2)

## 2019-06-08 LAB — COMPREHENSIVE METABOLIC PANEL
ALT: 21 U/L (ref 0–44)
AST: 23 U/L (ref 15–41)
Albumin: 4.7 g/dL (ref 3.5–5.0)
Alkaline Phosphatase: 74 U/L (ref 38–126)
Anion gap: 9 (ref 5–15)
BUN: 19 mg/dL (ref 8–23)
CO2: 25 mmol/L (ref 22–32)
Calcium: 10.6 mg/dL — ABNORMAL HIGH (ref 8.9–10.3)
Chloride: 103 mmol/L (ref 98–111)
Creatinine, Ser: 0.8 mg/dL (ref 0.44–1.00)
GFR calc Af Amer: >60 mL/min (ref >60)
GFR calc non Af Amer: >60 mL/min (ref >60)
Glucose, Bld: 99 mg/dL (ref 70–99)
Potassium: 3.7 mmol/L (ref 3.5–5.1)
Sodium: 137 mmol/L (ref 135–145)
Total Bilirubin: 0.6 mg/dL (ref 0.3–1.2)
Total Protein: 7.6 g/dL (ref 6.5–8.1)

## 2019-06-08 NOTE — Progress Notes (Signed)
Patient ID: Cynthia Nelson, female   DOB: 28-Oct-1955, 64 y.o.   MRN: [id number]  Chief Complaint:  Left breast mass  History of Present Illness Cynthia Nelson is a 64 y.o. female with a palpable left breast mass of 2 mos. she had not noted any change over the last 2 months.  She has no history of nipple discharge, breast skin changes or breast pain.  She reports she does breast self-examinations at times.  She identified the mass herself.  She has a history of birth control, hysterectomy in 2005.  Denies any family history of breast cancer.  She has never been pregnant.  She had a Covid vaccination in her left arm which might be responsible for a slightly thickened cortex of some level 2 lymph nodes which were biopsied but came back negative.  The breast lesion is 7 cm from the nipple and its largest dimension is 1.5 cm.  Core biopsy revealed a 9 mm of tissue consistent with an invasive lobular carcinoma with associated atypical lobular hyperplasia.  She is ER/PR positive and HER-2 is pending on fish examination.  Past Medical History Past Medical History:  Diagnosis Date  . Psoriasis       Past Surgical History:  Procedure Laterality Date  . ABDOMINAL HYSTERECTOMY     partial- still has ovaries  . BREAST BIOPSY Left 06/01/2019   [place] bx mass at 1:00, venus marker, path pending  . BREAST BIOPSY Left 06/01/2019   [place] bx of LN, coil marker, path pending  . BREAST CYST ASPIRATION Right   . TONSILLECTOMY AND ADENOIDECTOMY      Allergies  Allergen Reactions  . Codeine Nausea And Vomiting    Current Outpatient Medications  Medication Sig Dispense Refill  . atorvastatin (LIPITOR) 20 MG tablet Take 1 tablet (20 mg total) by mouth daily. 90 tablet 1  . diclofenac sodium (VOLTAREN) 1 % GEL Apply 2 g topically 4 (four) times daily. 100 g 1  . OTEZLA 30 MG TABS TAKE 1 TABLET BY MOUTH TWICE DAILY 180 tablet 0   No current facility-administered medications for this visit.    Family History Family  History  Problem Relation Age of Onset  . Lung cancer Mother 23       smoker  . Ovarian cancer Sister 12  . Emphysema Father   . COPD Father   . Cancer Brother        kidney  . Cancer Maternal Grandmother   . Cancer Maternal Grandfather   . Heart disease Paternal Grandmother   . Hypertension Paternal Grandmother   . Heart disease Brother   . Breast cancer Neg Hx       Social History Social History   Tobacco Use  . Smoking status: Never Smoker  . Smokeless tobacco: Never Used  Substance Use Topics  . Alcohol use: Never  . Drug use: Never        Review of Systems  Constitutional: Negative for weight loss.  HENT: Negative.   Eyes: Negative.   Respiratory: Negative for shortness of breath.   Cardiovascular: Negative for chest pain.  Gastrointestinal: Negative for abdominal pain.  Genitourinary: Negative.   Musculoskeletal: Negative for back pain, joint pain and neck pain.  Skin: Negative.   Neurological: Negative.   Endo/Heme/Allergies: Negative.       Physical Exam Blood pressure (!) 142/93, pulse (!) 174, temperature (!) 94.6 F (34.8 C), temperature source Temporal, height '5\' 2"'$  (1.575 m), weight 147 lb 6.4 oz (66.9 kg), SpO2  97 %. Last Weight  Most recent update: 06/08/2019 11:39 AM   Weight  66.9 kg (147 lb 6.4 oz)            CONSTITUTIONAL: Well developed, and nourished, appropriately responsive and aware without distress.   EYES: Sclera non-icteric.   EARS, NOSE, MOUTH AND THROAT: Mask worn.     Hearing is intact to voice.  NECK: Trachea is midline, and there is no jugular venous distension.  LYMPH NODES:  Lymph nodes in the neck are not enlarged. RESPIRATORY:  Lungs are clear, and breath sounds are equal bilaterally. Normal respiratory effort without pathologic use of accessory muscles. CARDIOVASCULAR: Heart is regular in rate and rhythm. GI: The abdomen is  soft, nontender, and nondistended. There were no palpable masses. GU: Left breast with vague  UOQ density with ecchymosis, mobile breast quadrant.   MUSCULOSKELETAL:  Symmetrical muscle tone appreciated in all four extremities.    SKIN: Skin turgor is normal. No pathologic skin lesions appreciated.  NEUROLOGIC:  Motor and sensation appear grossly normal.  Cranial nerves are grossly without defect. PSYCH:  Alert and oriented to person, place and time. Affect is appropriate for situation.  Data Reviewed I have personally reviewed what is currently available of the patient's imaging, recent labs and medical records.   Labs:  CBC Latest Ref Rng & Units 05/29/2019  WBC 3.4 - 10.8 x10E3/uL 6.1  Hemoglobin 11.1 - 15.9 g/dL 16.4(H)  Hematocrit 34.0 - 46.6 % 46.1  Platelets 150 - 450 x10E3/uL 284   CMP Latest Ref Rng & Units 05/29/2019  Glucose 65 - 99 mg/dL 80  BUN 8 - 27 mg/dL 13  Creatinine 0.57 - 1.00 mg/dL 0.84  Sodium 134 - 144 mmol/L 139  Potassium 3.5 - 5.2 mmol/L 4.5  Chloride 96 - 106 mmol/L 99  CO2 20 - 29 mmol/L 26  Calcium 8.7 - 10.3 mg/dL 10.7(H)  Total Protein 6.0 - 8.5 g/dL 6.8  Total Bilirubin 0.0 - 1.2 mg/dL 0.4  Alkaline Phos 39 - 117 IU/L 89  AST 0 - 40 IU/L 25  ALT 0 - 32 IU/L 18   SURGICAL PATHOLOGY  * THIS IS AN ADDENDUM REPORT *  CASE: [id number]  PATIENT: Cynthia Nelson  Surgical Pathology Report  *Addendum *   Reason for Addendum #1: Breast Biomarker Results   Specimen Submitted:  A. Breast, left  B. Axilla, left   Clinical History: 64 year old female with 1.7 cm palpable mass in the 1  o'clock position of the left breast and abnormal cortical thickening of  left axillary lymph node. The patient had 2nd Moderna vaccine left arm  04-28-19. 1. IMC 2. Metastatic vs reactive LN; a Venus shaped tissue  marker clip was deployed       DIAGNOSIS:  A. BREAST, LEFT 1:00 7 CM FN; ULTRASOUND-GUIDED BIOPSY:  - INVASIVE LOBULAR CARCINOMA, CLASSIC TYPE.  - ATYPICAL LOBULAR HYPERPLASIA.  Size of invasive carcinoma: 9 mm in this sample  Histologic  grade of invasive carcinoma: Grade 2            Glandular/tubular differentiation score: 3            Nuclear pleomorphism score: 2            Mitotic rate score:1            Total score: 6  Ductal carcinoma in situ: Not identified  Lymphovascular invasion: Not identified   B. LYMPH NODE, LEFT AXILLA; ULTRASOUND-GUIDED BIOPSY:  - LYMPH NODE,  NEGATIVE FOR METASTATIC CARCINOMA.  - DEEPER SECTIONS WERE EXAMINED.   Comment:  The definitive grade will be assigned on the excisional specimen.  ER/PR/HER2 immunohistochemistry will be performed on block A1, with  reflex to [place] for HER2 2+. The results will be reported in an addendum.   GROSS DESCRIPTION:  A. Labeled: Left breast 1:00 7 cm from nipple  Received: Formalin  Time/date in fixative: Collected and placed into formalin at 3:24 PM on  06/01/2019.  Cold ischemic time: Less than 1 minute  Total fixation time: 6 hours  Core pieces: 3  Size: Ranging from 0.5-1.3 cm in length and 0.2 cm in diameter  Description: Received are needle core biopsy fragments of tan-yellow,  fibrofatty tissue  Ink color: Blue  Entirely submitted in cassette 1.   B. Labeled: Left axilla lymph node  Received: Formalin  Time/date in fixative: Collected and placed into formalin at 3:33 PM on  06/01/2019.  Cold ischemic time: Less than 1 minute  Total fixation time: 6 hours  Core pieces: 3  Size: Ranging from 0.7-1.2 cm in length and 0.2 cm in diameter  Description: Received are needle core biopsy fragments of tan-yellow  fibrofatty tissue  Ink color: Black  Entirely submitted in 1 cassette.      Final Diagnosis performed by Quay Burow, MD.  Electronically signed  06/05/2019 1:12:50PM  The electronic signature indicates that the named Attending Pathologist  has evaluated the specimen  Technical component performed at [facility], [address] [place],  [place] [address] Lab: [phone] Dir: Rush Farmer, MD, MMM  Professional component performed at [facility], [facility], [place], [place], [place] [address] Lab: [phone]  Dir: Dellia Nims. Rubinas, MD   ADDENDUM:  BREAST BIOMARKER TESTS  Estrogen Receptor (ER) Status: POSITIVE            Percentage of cells with nuclear positivity:> 90%            Average intensity of staining: Strong   Progesterone Receptor (PgR) Status: POSITIVE            Percentage of cells with nuclear positivity: 1-10%            Average intensity of staining: Moderate   HER2 (by immunohistochemistry): EQUIVOCAL (Score 2+)            Percentage of cells with uniform intense complete  membrane staining: 10%  HER2 FISH will be performed and reported in an addendum.   Cold Ischemia and Fixation Times: Meet requirements specified in latest  version of the ASCO/CAP guidelines  Testing Performed on Block Number(s): A1    Imaging: Radiology review:   CLINICAL DATA:  64 year old presenting with a palpable lump in the [place] of the LEFT breast which she initially noted approximately 2 months ago without interval change. Annual evaluation, RIGHT breast.  The patient recently found new employment and therefore has medical insurance which she did not have at the time she found a lump approximately 2 months ago. She also states that she had both doses of the Moderna COVID-19 vaccine in the LEFT arm, the second dose on 04/28/2019.  EXAM: DIGITAL DIAGNOSTIC BILATERAL MAMMOGRAM WITH CAD AND TOMO  ULTRASOUND LEFT BREAST  COMPARISON:  Previous exam(s).  ACR Breast Density Category b: There are scattered areas of fibroglandular density.  FINDINGS: Tomosynthesis and synthesized full field CC and MLO views of both breasts were obtained. Tomosynthesis and synthesized spot-compression CC and MLO views of the area of  concern in the LEFT breast were  obtained.  Corresponding to the palpable concern in the UPPER OUTER QUADRANT of the LEFT breast is a hyperdense mass associated with architectural distortion. There are associated faint calcifications in this location. The distortion persists on the spot compression images. No suspicious findings elsewhere in the LEFT breast.  No findings suspicious for malignancy in the RIGHT breast.  Mammographic images were processed with CAD.  On correlative physical exam, there is palpable thickening in the UPPER OUTER QUADRANT of the LEFT breast corresponding to what the patient is feeling.  Targeted LEFT breast ultrasound is performed, showing an irregular hypoechoic mass with vague and angular margins at the 1 o'clock position approximately 7 cm from the nipple at MIDDLE to POSTERIOR depth, measuring approximately 1.5 x 1.0 x 0.9 cm, demonstrating internal power Doppler flow and demonstrating acoustic shadowing, associated with architectural distortion, accounting for the palpable concern.  Sonographic evaluation of the LEFT axilla demonstrates a solitary deep level 2 abnormal lymph node demonstrating cortical thickening up to approximately 9 mm and power Doppler flow within the thickened cortex.  IMPRESSION: 1. Highly suspicious 1.5 cm mass involving the UPPER OUTER QUADRANT of the LEFT breast, associated with architectural distortion, accounting for the palpable concern. 2. Solitary abnormal LEFT axillary lymph node demonstrating cortical thickening up to 9 mm. (As the patient had the second dose of her COVID-19 vaccine in the LEFT arm 4 weeks ago, this may represent a reactive lymph node).  RECOMMENDATION: Ultrasound-guided core needle biopsy of the LEFT breast mass and the solitary abnormal LEFT axillary lymph node.  The ultrasound core needle biopsy procedures were discussed with the patient and her questions were answered. She wishes to proceed and she will be  contacted by the Breast Navigator at the [facility] in order to schedule the biopsy.  I have discussed the findings and recommendations with the patient.  BI-RADS CATEGORY  5: Highly suggestive of malignancy.   Electronically Signed   By: Evangeline Dakin M.D.   On: 05/29/2019 11:24  Within last 24 hrs: No results found.  Assessment    Left breast invasive lobular carcinoma, ER/PR +  1.5 cm on U/S.  Patient Active Problem List   Diagnosis Date Noted  . Essential hypertension 05/29/2019  . Psoriasis   . Juvenile rheumatoid arthritis ([place]) 09/02/2018  . History of duodenal ulcer 09/02/2018    Plan  I discussed the available options with the patient. The risk of recurrence is similar between mastectomy and lumpectomy with radiation.  I also discussed that given the small size of the cancer would recommend localization lumpectomy with radiation to follow.  I also discussed that we would need to do a sentinel lymph node biopsy to check the nodes.  Explained to the patient that after her surgical treatment additional treatment will depend on her prognostic indicators and stage.    I discussed risks of bleeding, infection, damage to surrounding tissues, having positive margins, needing further resection, damage to nerves causing arm numbness or difficulty raising arm, causing lymphedema in the arm; as well as anesthesia risks of MI, stroke, prolonged ventilation, pulmonary embolism, thrombosis and even death.   Patient was given the opportunity to ask questions and have them answered.  They would like to proceed with left breast RFID localized lumpectomy with sentinel lymph node biopsy.       Face-to-face time spent with the patient and accompanying care providers(if present) was 35 minutes, with more than 50% of the time  spent counseling, educating, and coordinating care of the patient.      Ronny Bacon M.D., FACS 06/08/2019, 1:15 PM

## 2019-06-08 NOTE — Progress Notes (Signed)
Patient here to establish are for invasive carcinoma of breast

## 2019-06-08 NOTE — Progress Notes (Addendum)
Hematology/Oncology Consult note [facility] Telephone:([phone] [phone]   Patient Care Team: Volney American, PA-C as PCP - General (Family Medicine) Theodore Demark, RN as Oncology Nurse Navigator  REFERRING PROVIDER: Volney American,*  CHIEF COMPLAINTS/REASON FOR VISIT:  Evaluation of breast cancer  HISTORY OF PRESENTING ILLNESS:   Cynthia Nelson is a  64 y.o.  female with [place] listed below was seen in consultation at the request of  Volney American,*  for evaluation of breast cancer  She self palpated left breast mass 2 months.  Denies any nipple discharge, breast skin changes or breast pain. 05/29/2019 bilateral diagnostic mammogram showed a highly suspicious 1.5 cm mass involving the upper outer quadrant of the left breast, associated with architectural distortion.  Accounting for the palpable concern. Solitary abnormal left axillary lymph node with cortical thickening up to 9 mm.  Recent COVID-19 vaccination in the left arm about 4 weeks prior to the examination.  Patient underwent biopsy of the left breast mass and left axillary lymph node. 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 Grade 2, lymph node biopsy was negative for metastatic carcinoma. ER 90% PR 1 to 10%, HER-2 equivocal by IHC.  HER-2 FISH is pending.  Menarche age of 10, Patient has a history of birth control  Use for less than a year. She is single, no children.  No previous pregnancy. Partial hysterectomy due to fibroid disease in 2005. Previous breast biopsy 2003 FNA right breast, cyst aspiration. Denies any chest radiation. She reports a family history significant for sister deceased from ovarian cancer at age of 49, brother had kidney cancer.  Mother who was a smoker deceased from lung cancer. Grandfather and grand mother had history of cancer.  Details not available.     Review of Systems   Constitutional: Negative for appetite change, chills, fatigue and fever.  HENT:   Negative for hearing loss and voice change.   Eyes: Negative for eye problems.  Respiratory: Negative for chest tightness and cough.   Cardiovascular: Negative for chest pain.  Gastrointestinal: Negative for abdominal distention, abdominal pain and blood in stool.  Endocrine: Negative for hot flashes.  Genitourinary: Negative for difficulty urinating and frequency.   Musculoskeletal: Negative for arthralgias.  Skin: Negative for itching and rash.  Neurological: Negative for extremity weakness.  Hematological: Negative for adenopathy.  Psychiatric/Behavioral: Negative for confusion.    MEDICAL HISTORY:  Past Medical History:  Diagnosis Date  . Hyperlipidemia   . Hypertension   . Psoriasis     SURGICAL HISTORY: Past Surgical History:  Procedure Laterality Date  . ABDOMINAL HYSTERECTOMY     partial- still has ovaries  . BREAST BIOPSY Left 06/01/2019   [place] bx mass at 1:00, venus marker, path pending  . BREAST BIOPSY Left 06/01/2019   [place] bx of LN, coil marker, path pending  . BREAST CYST ASPIRATION Right   . TONSILLECTOMY AND ADENOIDECTOMY      SOCIAL HISTORY: Social History   Socioeconomic History  . Marital status: Single    Spouse name: Not on file  . Number of children: Not on file  . Years of education: Not on file  . Highest education level: Not on file  Occupational History  . Occupation: [employer]   Tobacco Use  . Smoking status: Never Smoker  . Smokeless tobacco: Never Used  Substance and Sexual Activity  . Alcohol use: Never  . Drug use: Never  .  Sexual activity: Not on file  Other Topics Concern  . Not on file  Social History Narrative  . Not on file   Social Determinants of Health   Financial Resource Strain:   . Difficulty of Paying Living Expenses:   Food Insecurity:   . Worried About Charity fundraiser in the Last Year:   . Arboriculturist in the  Last Year:   Transportation Needs:   . Film/video editor (Medical):   Marland Kitchen Lack of Transportation (Non-Medical):   Physical Activity:   . Days of Exercise per Week:   . Minutes of Exercise per Session:   Stress:   . Feeling of Stress :   Social Connections:   . Frequency of Communication with Friends and Family:   . Frequency of Social Gatherings with Friends and Family:   . Attends Religious Services:   . Active Member of Clubs or Organizations:   . Attends Archivist Meetings:   Marland Kitchen Marital Status:   Intimate Partner Violence:   . Fear of Current or Ex-Partner:   . Emotionally Abused:   Marland Kitchen Physically Abused:   . Sexually Abused:     FAMILY HISTORY: Family History  Problem Relation Age of Onset  . Lung cancer Mother 58       smoker  . Ovarian cancer Sister 85  . Emphysema Father   . COPD Father   . Cancer Brother        kidney  . Cancer Maternal Grandmother   . Cancer Maternal Grandfather   . Heart disease Paternal Grandmother   . Hypertension Paternal Grandmother   . Heart disease Brother   . Kidney cancer Brother   . Breast cancer Neg Hx     ALLERGIES:  is allergic to codeine.  MEDICATIONS:  Current Outpatient Medications  Medication Sig Dispense Refill  . acetaminophen (TYLENOL) 500 MG tablet Take 500 mg by mouth daily as needed for moderate pain or headache.    Marland Kitchen atorvastatin (LIPITOR) 20 MG tablet Take 1 tablet (20 mg total) by mouth daily. 90 tablet 1  . Chlorpheniramine-DM (CORICIDIN HBP COUGH/COLD PO) Take 1 tablet by mouth daily as needed (congestion).    Marland Kitchen diclofenac sodium (VOLTAREN) 1 % GEL Apply 2 g topically 4 (four) times daily. (Patient taking differently: Apply 2 g topically 4 (four) times daily as needed (pain). ) 100 g 1  . OTEZLA 30 MG TABS TAKE 1 TABLET BY MOUTH TWICE DAILY (Patient taking differently: Take 30 mg by mouth 2 (two) times daily. ) 180 tablet 0   No current facility-administered medications for this visit.      PHYSICAL EXAMINATION: ECOG PERFORMANCE STATUS: 0 - Asymptomatic Vitals:   06/08/19 1341  BP: (!) 161/96  Pulse: 87  Resp: 18  Temp: (!) 97.4 F (36.3 C)   Filed Weights   06/08/19 1341  Weight: 148 lb (67.1 kg)    Physical Exam Constitutional:      General: She is not in acute distress. HENT:     Head: Normocephalic and atraumatic.  Eyes:     General: No scleral icterus. Cardiovascular:     Rate and Rhythm: Normal rate and regular rhythm.     Heart sounds: Normal heart sounds.  Pulmonary:     Effort: Pulmonary effort is normal. No respiratory distress.     Breath sounds: No wheezing.  Abdominal:     General: Bowel sounds are normal. There is no distension.     Palpations:  Abdomen is soft.  Musculoskeletal:        General: No deformity. Normal range of motion.     Cervical back: Normal range of motion and neck supple.  Skin:    General: Skin is warm and dry.     Findings: No erythema or rash.  Neurological:     Mental Status: She is alert and oriented to person, place, and time. Mental status is at baseline.     Cranial Nerves: No cranial nerve deficit.     Coordination: Coordination normal.  Psychiatric:        Mood and Affect: Mood normal.   Breast exam was performed in seated and lying down position. Patient is Status post left breastUpper outer quadrant With focal bruising at the site of biopsy.  Palpable mass. Right breast no palpable mass.  No palpable axillary lymphadenopathy.  LABORATORY DATA:  I have reviewed the data as listed Lab Results  Component Value Date   WBC 8.3 06/08/2019   HGB 16.8 (H) 06/08/2019   HCT 48.2 (H) 06/08/2019   MCV 94.1 06/08/2019   PLT 315 06/08/2019   Recent Labs    05/29/19 1136 06/08/19 1444  NA 139 137  K 4.5 3.7  CL 99 103  CO2 26 25  GLUCOSE 80 99  BUN 13 19  CREATININE 0.84 0.80  CALCIUM 10.7* 10.6*  GFRNONAA 75 >60  GFRAA 86 >60  PROT 6.8 7.6  ALBUMIN 4.4 4.7  AST 25 23  ALT 18 21  ALKPHOS 89  74  BILITOT 0.4 0.6   Iron/TIBC/Ferritin/ %Sat No results found for: IRON, TIBC, FERRITIN, IRONPCTSAT    RADIOGRAPHIC STUDIES: I have personally reviewed the radiological images as listed and agreed with the findings in the report. US BREAST LTD UNI LEFT INC AXILLA  Result Date: 05/29/2019 CLINICAL DATA:  64 year old presenting with a palpable lump in the UPPER OUTER QUADRANT of the LEFT breast which she initially noted approximately 2 months ago without interval change. Annual evaluation, RIGHT breast. The patient recently found new employment and therefore has medical insurance which she did not have at the time she found a lump approximately 2 months ago. She also states that she had both doses of the Moderna COVID-19 vaccine in the LEFT arm, the second dose on 04/28/2019. EXAM: DIGITAL DIAGNOSTIC BILATERAL MAMMOGRAM WITH CAD AND TOMO ULTRASOUND LEFT BREAST COMPARISON:  Previous exam(s). ACR Breast Density Category b: There are scattered areas of fibroglandular density. FINDINGS: Tomosynthesis and synthesized full field CC and MLO views of both breasts were obtained. Tomosynthesis and synthesized spot-compression CC and MLO views of the area of concern in the LEFT breast were obtained. Corresponding to the palpable concern in the UPPER OUTER QUADRANT of the LEFT breast is a hyperdense mass associated with architectural distortion. There are associated faint calcifications in this location. The distortion persists on the spot compression images. No suspicious findings elsewhere in the LEFT breast. No findings suspicious for malignancy in the RIGHT breast. Mammographic images were processed with CAD. On correlative physical exam, there is palpable thickening in the UPPER OUTER QUADRANT of the LEFT breast corresponding to what the patient is feeling. Targeted LEFT breast ultrasound is performed, showing an irregular hypoechoic mass with vague and angular margins at the 1 o'clock position approximately 7  cm from the nipple at MIDDLE to POSTERIOR depth, measuring approximately 1.5 x 1.0 x 0.9 cm, demonstrating internal power Doppler flow and demonstrating acoustic shadowing, associated with architectural distortion, accounting for the palpable concern.  Sonographic evaluation of the LEFT axilla demonstrates a solitary deep level 2 abnormal lymph node demonstrating cortical thickening up to approximately 9 mm and power Doppler flow within the thickened cortex. IMPRESSION: 1. Highly suspicious 1.5 cm mass involving the UPPER OUTER QUADRANT of the LEFT breast, associated with architectural distortion, accounting for the palpable concern. 2. Solitary abnormal LEFT axillary lymph node demonstrating cortical thickening up to 9 mm. (As the patient had the second dose of her COVID-19 vaccine in the LEFT arm 4 weeks ago, this may represent a reactive lymph node). RECOMMENDATION: Ultrasound-guided core needle biopsy of the LEFT breast mass and the solitary abnormal LEFT axillary lymph node. The ultrasound core needle biopsy procedures were discussed with the patient and her questions were answered. She wishes to proceed and she will be contacted by the Breast Navigator at the [facility] in order to schedule the biopsy. I have discussed the findings and recommendations with the patient. BI-RADS CATEGORY  5: Highly suggestive of malignancy. Electronically Signed   By: Evangeline Dakin M.D.   On: 05/29/2019 11:24   MM DIAG BREAST TOMO BILATERAL  Result Date: 05/29/2019 CLINICAL DATA:  64 year old presenting with a palpable lump in the UPPER OUTER QUADRANT of the LEFT breast which she initially noted approximately 2 months ago without interval change. Annual evaluation, RIGHT breast. The patient recently found new employment and therefore has medical insurance which she did not have at the time she found a lump approximately 2 months ago. She also states that she had both doses of the Moderna COVID-19 vaccine in the  LEFT arm, the second dose on 04/28/2019. EXAM: DIGITAL DIAGNOSTIC BILATERAL MAMMOGRAM WITH CAD AND TOMO ULTRASOUND LEFT BREAST COMPARISON:  Previous exam(s). ACR Breast Density Category b: There are scattered areas of fibroglandular density. FINDINGS: Tomosynthesis and synthesized full field CC and MLO views of both breasts were obtained. Tomosynthesis and synthesized spot-compression CC and MLO views of the area of concern in the LEFT breast were obtained. Corresponding to the palpable concern in the UPPER OUTER QUADRANT of the LEFT breast is a hyperdense mass associated with architectural distortion. There are associated faint calcifications in this location. The distortion persists on the spot compression images. No suspicious findings elsewhere in the LEFT breast. No findings suspicious for malignancy in the RIGHT breast. Mammographic images were processed with CAD. On correlative physical exam, there is palpable thickening in the UPPER OUTER QUADRANT of the LEFT breast corresponding to what the patient is feeling. Targeted LEFT breast ultrasound is performed, showing an irregular hypoechoic mass with vague and angular margins at the 1 o'clock position approximately 7 cm from the nipple at MIDDLE to POSTERIOR depth, measuring approximately 1.5 x 1.0 x 0.9 cm, demonstrating internal power Doppler flow and demonstrating acoustic shadowing, associated with architectural distortion, accounting for the palpable concern. Sonographic evaluation of the LEFT axilla demonstrates a solitary deep level 2 abnormal lymph node demonstrating cortical thickening up to approximately 9 mm and power Doppler flow within the thickened cortex. IMPRESSION: 1. Highly suspicious 1.5 cm mass involving the UPPER OUTER QUADRANT of the LEFT breast, associated with architectural distortion, accounting for the palpable concern. 2. Solitary abnormal LEFT axillary lymph node demonstrating cortical thickening up to 9 mm. (As the patient had the  second dose of her COVID-19 vaccine in the LEFT arm 4 weeks ago, this may represent a reactive lymph node). RECOMMENDATION: Ultrasound-guided core needle biopsy of the LEFT breast mass and the solitary abnormal LEFT axillary lymph node. The ultrasound  core needle biopsy procedures were discussed with the patient and her questions were answered. She wishes to proceed and she will be contacted by the Breast Navigator at the [facility] in order to schedule the biopsy. I have discussed the findings and recommendations with the patient. BI-RADS CATEGORY  5: Highly suggestive of malignancy. Electronically Signed   By: Evangeline Dakin M.D.   On: 05/29/2019 11:24   MM CLIP PLACEMENT LEFT  Result Date: 06/01/2019 CLINICAL DATA:  Status post ultrasound-guided core needle biopsy a 1.5 cm mass in the 1 o'clock position of the left breast and ultrasound-guided core needle biopsy of an abnormal left axillary node. EXAM: DIAGNOSTIC LEFT MAMMOGRAM POST ULTRASOUND BIOPSY X 2 COMPARISON:  Previous exam(s). FINDINGS: Mammographic images were obtained following ultrasound guided biopsy of the recently demonstrated 1.5 cm mass in the 1 o'clock position of the left breast and the recently demonstrated abnormal left axillary lymph node. The Venus shaped biopsy marking clip is in the biopsied mass in the 1 o'clock position of the left breast. The coil shaped biopsy marker clip in the biopsied left axillary lymph node could not be included due to its deep location in the axilla. This was well seen at ultrasound in the lymph node following clip placement. IMPRESSION: Appropriate positioning of the Venus shaped biopsy marking clip at the site of biopsy in the biopsied mass in the 1 o'clock position of the left breast. Final Assessment: Post Procedure Mammograms for Marker Placement Electronically Signed   By: Claudie Revering M.D.   On: 06/01/2019 15:54   [place] LT BREAST BX W LOC DEV 1ST LESION IMG BX SPEC US GUIDE  Addendum  Date: 06/07/2019   ADDENDUM REPORT: 06/07/2019 09:19 ADDENDUM: PATHOLOGY revealed: A. BREAST, LEFT 1:00 7 CM FN; ULTRASOUND-GUIDED BIOPSY: - INVASIVE LOBULAR CARCINOMA, CLASSIC TYPE. - ATYPICAL LOBULAR HYPERPLASIA. 9 mm in this sample. Grade 2. Ductal carcinoma in situ: Not identified. Lymphovascular invasion: Not identified. B. LYMPH NODE, LEFT AXILLA; ULTRASOUND-GUIDED BIOPSY: - LYMPH NODE, NEGATIVE FOR METASTATIC CARCINOMA. - DEEPER SECTIONS WERE EXAMINED. Comment: The definitive grade will be assigned on the excisional specimen. Pathology results are CONCORDANT with imaging findings, per Dr. Claudie Revering. Pathology results and recommendations below were discussed with patient by telephone on 06/06/2019. Patient reported biopsy site doing well with slight tenderness at the site. Post biopsy care instructions were reviewed and questions were answered. Patient was instructed to call [facility] if any concerns or questions arise related to the biopsy. Recommendation: Surgical referral and bilateral breast MRI given lobular histology. Request for surgical referral was relayed to [place] and Tanya Nones RN at [facility] by Electa Sniff RN on 06/06/2019. Addendum by Electa Sniff RN on 06/07/2019. Electronically Signed   By: Claudie Revering M.D.   On: 06/07/2019 09:19   Result Date: 06/07/2019 CLINICAL DATA:  1.7 cm palpable mass in the 1 o'clock position of the left breast with imaging features highly suspicious for malignancy. Single left axillary lymph node with abnormal cortical thickening. The patient received 2 Moderna vaccines in the left arm, the most recent dated 04/28/2019. EXAM: ULTRASOUND GUIDED LEFT BREAST CORE NEEDLE BIOPSY ULTRASOUND-GUIDED LEFT AXILLARY LYMPH NODE CORE NEEDLE BIOPSY COMPARISON:  Previous exam(s). PROCEDURE: I met with the patient and we discussed the procedure of ultrasound-guided biopsy, including benefits and alternatives. We discussed the high  likelihood of a successful procedure. We discussed the risks of the procedure, including infection, bleeding, tissue injury, clip migration, and inadequate  sampling. Informed written consent was given. The usual time-out protocol was performed immediately prior to the procedure. SITE #1: 1.5 CM MASS 1 OC LEFT BREAST Lesion quadrant: Upper outer quadrant Using sterile technique and 1% Lidocaine as local anesthetic, under direct ultrasound visualization, a 12 gauge spring-loaded device was used to perform biopsy of the recently demonstrated 1.5 cm mass in the 1 o'clock position of the breast, 7 cm from the nipple, using an inferolateral approach. At the conclusion of the procedure a Venus shaped tissue marker clip was deployed into the biopsy cavity. SITE #2: ABNORMAL LEFT AXILLARY LYMPH NODE Using sterile technique and 1% Lidocaine as local anesthetic, under direct ultrasound visualization, a 14 gauge spring-loaded device was used to perform biopsy of the recently demonstrated left axillary lymph node with abnormal cortical thickening using a caudal approach. At the conclusion of the procedure a coil shaped tissue marker clip was deployed into the biopsy cavity. Follow up 2 view mammogram was performed and dictated separately. IMPRESSION: Ultrasound guided biopsies of the recently demonstrated 1.5 cm mass in the 1 o'clock position of the left breast and abnormal left axillary lymph node. No apparent complications. Electronically Signed: By: Claudie Revering M.D. On: 06/01/2019 15:41   [place] LT BREAST BX W LOC DEV EA ADD LESION IMG BX SPEC US GUIDE  Addendum Date: 06/07/2019   ADDENDUM REPORT: 06/07/2019 09:19 ADDENDUM: PATHOLOGY revealed: A. BREAST, LEFT 1:00 7 CM FN; ULTRASOUND-GUIDED BIOPSY: - INVASIVE LOBULAR CARCINOMA, CLASSIC TYPE. - ATYPICAL LOBULAR HYPERPLASIA. 9 mm in this sample. Grade 2. Ductal carcinoma in situ: Not identified. Lymphovascular invasion: Not identified. B. LYMPH NODE, LEFT AXILLA;  ULTRASOUND-GUIDED BIOPSY: - LYMPH NODE, NEGATIVE FOR METASTATIC CARCINOMA. - DEEPER SECTIONS WERE EXAMINED. Comment: The definitive grade will be assigned on the excisional specimen. Pathology results are CONCORDANT with imaging findings, per Dr. Claudie Revering. Pathology results and recommendations below were discussed with patient by telephone on 06/06/2019. Patient reported biopsy site doing well with slight tenderness at the site. Post biopsy care instructions were reviewed and questions were answered. Patient was instructed to call [facility] if any concerns or questions arise related to the biopsy. Recommendation: Surgical referral and bilateral breast MRI given lobular histology. Request for surgical referral was relayed to [place] and Tanya Nones RN at [facility] by Electa Sniff RN on 06/06/2019. Addendum by Electa Sniff RN on 06/07/2019. Electronically Signed   By: Claudie Revering M.D.   On: 06/07/2019 09:19   Result Date: 06/07/2019 CLINICAL DATA:  1.7 cm palpable mass in the 1 o'clock position of the left breast with imaging features highly suspicious for malignancy. Single left axillary lymph node with abnormal cortical thickening. The patient received 2 Moderna vaccines in the left arm, the most recent dated 04/28/2019. EXAM: ULTRASOUND GUIDED LEFT BREAST CORE NEEDLE BIOPSY ULTRASOUND-GUIDED LEFT AXILLARY LYMPH NODE CORE NEEDLE BIOPSY COMPARISON:  Previous exam(s). PROCEDURE: I met with the patient and we discussed the procedure of ultrasound-guided biopsy, including benefits and alternatives. We discussed the high likelihood of a successful procedure. We discussed the risks of the procedure, including infection, bleeding, tissue injury, clip migration, and inadequate sampling. Informed written consent was given. The usual time-out protocol was performed immediately prior to the procedure. SITE #1: 1.5 CM MASS 1 OC LEFT BREAST Lesion quadrant: Upper outer quadrant Using  sterile technique and 1% Lidocaine as local anesthetic, under direct ultrasound visualization, a 12 gauge spring-loaded device was used to perform biopsy of the recently demonstrated  1.5 cm mass in the 1 o'clock position of the breast, 7 cm from the nipple, using an inferolateral approach. At the conclusion of the procedure a Venus shaped tissue marker clip was deployed into the biopsy cavity. SITE #2: ABNORMAL LEFT AXILLARY LYMPH NODE Using sterile technique and 1% Lidocaine as local anesthetic, under direct ultrasound visualization, a 14 gauge spring-loaded device was used to perform biopsy of the recently demonstrated left axillary lymph node with abnormal cortical thickening using a caudal approach. At the conclusion of the procedure a coil shaped tissue marker clip was deployed into the biopsy cavity. Follow up 2 view mammogram was performed and dictated separately. IMPRESSION: Ultrasound guided biopsies of the recently demonstrated 1.5 cm mass in the 1 o'clock position of the left breast and abnormal left axillary lymph node. No apparent complications. Electronically Signed: By: Claudie Revering M.D. On: 06/01/2019 15:41      ASSESSMENT & PLAN:  1. Invasive carcinoma of breast ([place])   2. Lobular carcinoma of left breast ([place])   3. Family history of cancer   4. Hypercalcemia   5. Erythrocytosis    Left breast cancer, cT1c N0 invasive lobular cancer, grade 2, ER 90%, PR 1-10% HER-2 equivocal by IHC. Images were independently reviewed by me.  Pathology Was reviewed and discussed with patient. Diagnosis of left breast cancer was discussed. And that she has invasive lobular cancer, I recommend bilateral MRI breast for further evaluation. Awaiting HER2 FISH results.  Family history of ovarian cancer, kidney cancer and personal history of breast cancer, Recommend patient to have genetic testing.  Urgent referral to genetic counselor.  I will check CBC, CMP, CA 15.3, CA 27.29.  -Labs reviewed.  CBC  showed hemoglobin 16.8.  Hematocrit 48.2.  Etiology of erythrocytosis unclear need further work-up., Patient also has borderline hypercalcemia.  Will need to check PTH, PTH RP, check SPEP, erythropoietin level   Orders Placed This Encounter  Procedures  . MR BREAST BILATERAL W WO CONTRAST INC CAD    Standing Status:   Future    Standing Expiration Date:   08/07/2020    Order Specific Question:   ** REASON FOR EXAM (FREE TEXT)    Answer:   invasive carcinoma of breastc    Order Specific Question:   If indicated for the ordered procedure, I authorize the administration of contrast media per Radiology protocol    Answer:   Yes    Order Specific Question:   What is the patient's sedation requirement?    Answer:   No Sedation    Order Specific Question:   Does the patient have a pacemaker or implanted devices?    Answer:   No    Order Specific Question:   Radiology Contrast Protocol - do NOT remove file path    Answer:   \\[email or web address]    Order Specific Question:   Preferred imaging location?    Answer:   [facility] (table limit - 550lbs)  . MR BREAST BILATERAL W WO CONTRAST INC CAD    Standing Status:   Future    Standing Expiration Date:   08/07/2020    Order Specific Question:   ** REASON FOR EXAM (FREE TEXT)    Answer:   lobular breast cancer    Order Specific Question:   If indicated for the ordered procedure, I authorize the administration of contrast media per Radiology protocol    Answer:   Yes    Order Specific Question:   What is the patient's  sedation requirement?    Answer:   No Sedation    Order Specific Question:   Does the patient have a pacemaker or implanted devices?    Answer:   No    Order Specific Question:   Radiology Contrast Protocol - do NOT remove file path    Answer:   \\[email or web address]\[email or web address]    Order Specific Question:   Preferred imaging location?    Answer:   [facility] (table limit - 550lbs)  .  CBC with Differential/Platelet    Standing Status:   Future    Number of Occurrences:   1    Standing Expiration Date:   06/07/2020  . Comprehensive metabolic panel    Standing Status:   Future    Number of Occurrences:   1    Standing Expiration Date:   06/07/2020  . Cancer antigen 15-3    Standing Status:   Future    Number of Occurrences:   1    Standing Expiration Date:   06/07/2020  . Cancer antigen 27.29    Standing Status:   Future    Number of Occurrences:   1    Standing Expiration Date:   06/07/2020  . Ambulatory referral to Genetics    Referral Priority:   Urgent    Referral Type:   Consultation    Referral Reason:   Specialty Services Required    Number of Visits Requested:   1    All questions were answered. The patient knows to call the clinic with any problems questions or concerns.  cc Volney American,*    Return of visit: To be determined Thank you for this kind referral and the opportunity to participate in the care of this patient. A copy of today's note is routed to referring provider    Earlie Server, MD, PhD Hematology Oncology [facility] Pager- [phone] 06/08/2019   .  Addendum 06/25/2019. Genetic testing did not reveal pathological mutations.  I called patient and reviewed with her about the work-up results. Erythrocytosis, etiology unknown.  Less likely primary given that her JAK2 mutation with reflex to other mutations are negative.  Need to further narrow down possible secondary etiologies, i.e. sleep apnea, etc. Mild hypercalcemia, normal PTH, normal PTH RP, patient is not on oral calcium supplementation, not on thiazide.  Multiple myeloma panel showed no monoclonal proteins. Etiology of hypercalcemia is unknown.  I am not sure if the hypercalcemia is related to her breast cancer or not. Her breast cancer tumor markers are also significantly elevated which appear to be in proportion to her current breast cancer  burden. I discussed with patient about option of proceeding with CT chest abdomen pelvis for further staging.  She agrees with the plan.  I will arrange. If CT is negative, I think she can proceed with lumpectomy with sentinel lymph node biopsy.  Earlie Server

## 2019-06-08 NOTE — Patient Instructions (Addendum)
Our surgery scheduler Pamala Hurry will contact you within the next 24-48 hours for surgery. During that call, she will discuss the preparation prior to surgery and discuss the different dates and times. Please have the BLUE sheet available when she contacts you. If you have any questions or concerns, please give our office a call.   Breast Cancer, Female  Breast cancer is a malignant growth of tissue (tumor) in the breast. Unlike noncancerous (benign) tumors, malignant tumors are cancerous and can spread to other parts of the body. The two most common types of breast cancer start in the milk ducts (ductal carcinoma) or in the lobules where milk is made in the breast (lobular carcinoma). Breast cancer is one of the most common types of cancer in women. What are the causes? The exact cause of female breast cancer is unknown. What increases the risk? The following factors may make you more likely to develop this condition:  Being older than 63 years of age.  Race and ethnicity. Caucasian women generally have an increased risk, but African-American women are more likely to develop the disease before age 60.  Having a family history of breast cancer.  Having had breast cancer in the past.  Having certain noncancerous conditions of the breast, such as dense breast tissue.  Having the BRCA1 and BRCA2 genes.  Having a history of radiation exposure.  Obesity.  Starting menopause after age 52.  Starting your menstrual periods before age 89.  Having never been pregnant or having your first child after age 75.  Having never breastfed.  Using hormone therapy after menopause.  Using birth control pills.  Drinking more than one alcoholic drink a day.  Exposure to the drug DES, which was given to pregnant women from the 1940s to the 1970s. What are the signs or symptoms? Symptoms of this condition include:  A painless lump or thickening in your breast.  Changes in the size or shape of your  breast.  Breast skin changes, such as puckering or dimpling.  Nipple abnormalities, such as scaling, crustiness, redness, or pulling in (retraction).  Nipple discharge that is bloody or clear. How is this diagnosed? This condition may be diagnosed by:  Taking your medical history and doing a physical exam. During the exam, your health care provider will feel the tissue around your breast and under your arms.  Taking a sample of nipple discharge. The sample will be examined under a microscope.  Performing imaging tests, such as breast X-rays (mammogram), breast ultrasound exams, or an MRI.  Taking a tissue sample (biopsy) from the breast. The sample will be examined under a microscope to look for cancer cells.  Taking a sample from the lymph nodes near the affected breast (sentinel node biopsy). Your cancer will be staged to determine its severity and extent. Staging is a careful attempt to find out the size of the tumor, whether the cancer has spread, and if so, to what parts of the body. Staging also includes testing your tumor for certain receptors, such as estrogen, progesterone, and human epidermal growth factor receptor 2 (HER2). This will help your cancer care team decide on a treatment that will work best for you. You may need to have more tests to determine the stage of your cancer. Stages include the following:  Stage 0--The tumor has not spread to other breast tissue.  Stage I--The cancer is only found in the breast or may be in the lymph nodes. The tumor may be up to  in (2 cm) wide.  Stage II--The cancer has spread to nearby lymph nodes. The tumor may be up to 2 in (5 cm) wide.  Stage III--The cancer has spread to more distant lymph nodes. The tumor may be larger than 2 in (5 cm) wide.  Stage IV--The cancer has spread to other parts of the body, such as the bones, brain, liver, or lungs. How is this treated? Treatment for this condition depends on the type and stage of the  breast cancer. It may be treated with:  Surgery. This may involve breast-conserving surgery (lumpectomy or partial mastectomy) in which only the part of the breast containing the cancer is removed. Some normal tissue surrounding this area may also be removed. In some cases, surgery may be done to remove the entire breast (mastectomy) and nipple. Lymph nodes may also be removed.  Radiation therapy, which uses high-energy rays to kill cancer cells.  Chemotherapy, which is the use of drugs to kill cancer cells.  Hormone therapy, which involves taking medicine to adjust the hormone levels in your body. You may take medicine to decrease your estrogen levels. This can help stop cancer cells from growing.  Targeted therapy, in which drugs are used to block the growth and spread of cancer cells. These drugs target a specific part of the cancer cell and usually cause fewer side effects than chemotherapy. Targeted therapy may be used alone or in combination with chemotherapy.  A combination of surgery, radiation, chemotherapy, or hormone therapy may be needed to treat breast cancer. Follow these instructions at home:  Take over-the-counter and prescription medicines only as told by your health care provider.  Eat a healthy diet. A healthy diet includes lots of fruits and vegetables, low-fat dairy products, lean meats, and fiber. ? Make sure half your plate is filled with fruits or vegetables. ? Choose high-fiber foods such as whole-grain breads and cereals.  Consider joining a support group. This may help you learn to cope with the stress of having breast cancer.  Talk to your health care team about exercise and physical activity. The right exercise program can: ? Help prevent or reduce symptoms such as fatigue or depression. ? Improve overall health and survival rates.  Keep all follow-up visits as told by your health care provider. This is important. Where to find more information  American  Cancer Society: www.cancer.Justice: www.cancer.gov Contact a health care provider if:  You have a sudden increase in pain.  You have any symptoms or changes that concern you.  You lose weight without trying.  You notice a new lump in either breast or under your arm.  You develop swelling in either arm or hand.  You have a fever.  You notice new fatigue or weakness. Get help right away if:  You have chest pain or trouble breathing.  You faint. Summary  Breast cancer is a malignant growth of tissue (tumor) in the breast.  Your cancer will be staged to determine its severity and extent.  Treatment for this condition depends on the type and stage of the breast cancer. This information is not intended to replace advice given to you by your health care provider. Make sure you discuss any questions you have with your health care provider. Document Revised: 12/18/2016 Document Reviewed: 08/31/2016 Elsevier Patient Education  [place].

## 2019-06-09 ENCOUNTER — Telehealth: Payer: Self-pay

## 2019-06-09 DIAGNOSIS — C50919 Malignant neoplasm of unspecified site of unspecified female breast: Secondary | ICD-10-CM | POA: Insufficient documentation

## 2019-06-09 DIAGNOSIS — Z809 Family history of malignant neoplasm, unspecified: Secondary | ICD-10-CM | POA: Insufficient documentation

## 2019-06-09 DIAGNOSIS — C50912 Malignant neoplasm of unspecified site of left female breast: Secondary | ICD-10-CM | POA: Insufficient documentation

## 2019-06-09 LAB — CANCER ANTIGEN 15-3: CA 15-3: 43 U/mL — ABNORMAL HIGH (ref 0.0–25.0)

## 2019-06-09 LAB — CANCER ANTIGEN 27.29: CA 27.29: 55.8 U/mL — ABNORMAL HIGH (ref 0.0–38.6)

## 2019-06-09 NOTE — Telephone Encounter (Signed)
Pt notified and would like to come for labs on Tuesday 5/25 @ 10:45 since she will be around here. Please add lab on Tuesday. Thanks.

## 2019-06-09 NOTE — Telephone Encounter (Signed)
-----   Message from Earlie Server, MD sent at 06/09/2019  1:03 PM EDT ----- Please let her know that her hemoglobin level is high and also calcium level is high.  I recommend additional testing.  Please schedule lab encounter.  Orders are in.

## 2019-06-09 NOTE — Telephone Encounter (Signed)
Done..  labs scheduled on Tuesday 5/25 @ 10:45 per pt request.

## 2019-06-12 ENCOUNTER — Inpatient Hospital Stay (HOSPITAL_BASED_OUTPATIENT_CLINIC_OR_DEPARTMENT_OTHER): Payer: No Typology Code available for payment source | Admitting: Genetic Counselor

## 2019-06-12 DIAGNOSIS — Z808 Family history of malignant neoplasm of other organs or systems: Secondary | ICD-10-CM | POA: Diagnosis not present

## 2019-06-12 DIAGNOSIS — Z8051 Family history of malignant neoplasm of kidney: Secondary | ICD-10-CM | POA: Diagnosis not present

## 2019-06-12 DIAGNOSIS — Z8042 Family history of malignant neoplasm of prostate: Secondary | ICD-10-CM

## 2019-06-12 DIAGNOSIS — C50412 Malignant neoplasm of upper-outer quadrant of left female breast: Secondary | ICD-10-CM | POA: Diagnosis not present

## 2019-06-12 DIAGNOSIS — Z8041 Family history of malignant neoplasm of ovary: Secondary | ICD-10-CM | POA: Diagnosis not present

## 2019-06-12 DIAGNOSIS — Z170 Estrogen receptor positive status [ER+]: Secondary | ICD-10-CM

## 2019-06-13 ENCOUNTER — Encounter: Payer: Self-pay | Admitting: Genetic Counselor

## 2019-06-13 ENCOUNTER — Ambulatory Visit
Admission: RE | Admit: 2019-06-13 | Discharge: 2019-06-13 | Disposition: A | Discharge status: Home / Self Care | Payer: No Typology Code available for payment source | Source: Ambulatory Visit | Attending: Surgery | Admitting: Surgery

## 2019-06-13 ENCOUNTER — Ambulatory Visit
Admission: RE | Admit: 2019-06-13 | Discharge: 2019-06-13 | Disposition: A | Discharge status: Home / Self Care | Payer: No Typology Code available for payment source | Source: Ambulatory Visit | Attending: Oncology | Admitting: Oncology

## 2019-06-13 ENCOUNTER — Other Ambulatory Visit: Payer: Self-pay

## 2019-06-13 ENCOUNTER — Inpatient Hospital Stay: Payer: No Typology Code available for payment source

## 2019-06-13 ENCOUNTER — Encounter: Payer: Self-pay | Admitting: Oncology

## 2019-06-13 DIAGNOSIS — C50912 Malignant neoplasm of unspecified site of left female breast: Secondary | ICD-10-CM

## 2019-06-13 DIAGNOSIS — C50919 Malignant neoplasm of unspecified site of unspecified female breast: Secondary | ICD-10-CM

## 2019-06-13 DIAGNOSIS — C50412 Malignant neoplasm of upper-outer quadrant of left female breast: Secondary | ICD-10-CM | POA: Diagnosis not present

## 2019-06-13 DIAGNOSIS — Z8051 Family history of malignant neoplasm of kidney: Secondary | ICD-10-CM | POA: Insufficient documentation

## 2019-06-13 DIAGNOSIS — Z808 Family history of malignant neoplasm of other organs or systems: Secondary | ICD-10-CM | POA: Insufficient documentation

## 2019-06-13 DIAGNOSIS — Z809 Family history of malignant neoplasm, unspecified: Secondary | ICD-10-CM

## 2019-06-13 DIAGNOSIS — Z8042 Family history of malignant neoplasm of prostate: Secondary | ICD-10-CM | POA: Insufficient documentation

## 2019-06-13 DIAGNOSIS — Z8041 Family history of malignant neoplasm of ovary: Secondary | ICD-10-CM | POA: Insufficient documentation

## 2019-06-13 LAB — SURGICAL PATHOLOGY

## 2019-06-13 MED ORDER — GADOBUTROL 1 MMOL/ML IV SOLN
6.0000 mL | Freq: Once | INTRAVENOUS | Status: AC | PRN
  Administered 2019-06-13: 6 mL via INTRAVENOUS

## 2019-06-13 NOTE — Progress Notes (Signed)
REFERRING PROVIDER: Earlie Server, MD [place],  [place] [address]  PRIMARY PROVIDER:  Volney American, PA-C  PRIMARY REASON FOR VISIT:  1. Malignant neoplasm of upper-outer quadrant of left breast in female, estrogen receptor positive ([place])   2. Family history of ovarian cancer   3. Family history of kidney cancer   4. Family history of melanoma   5. Family history of prostate cancer      HISTORY OF PRESENT ILLNESS:   Cynthia Nelson, a 64 y.o. female, was seen for a [facility] cancer genetics consultation at the request of Dr. Tasia Catchings due to a personal and family history of cancer.  Cynthia Nelson presents to clinic today to discuss the possibility of a hereditary predisposition to cancer, genetic testing, and to further clarify her future cancer risks, as well as potential cancer risks for family members.   In 2021, at the age of 58, Cynthia Nelson was diagnosed with cancer of the left breast.   CANCER HISTORY:  Oncology History   No history exists.     RISK FACTORS:  Menarche was at age 83.  First live birth at age N/A.  Ovaries intact: yes.  Hysterectomy: yes.  Menopausal status: postmenopausal.  HRT use: 0 years. Colonoscopy: yes; normal. Mammogram within the last year: yes. Number of breast biopsies: 1. Up to date with pelvic exams: yes. Any excessive radiation exposure in the past: no  Past Medical History:  Diagnosis Date  . Family history of kidney cancer   . Family history of melanoma   . Family history of ovarian cancer   . Family history of prostate cancer   . Hyperlipidemia   . Hypertension   . Psoriasis     Past Surgical History:  Procedure Laterality Date  . ABDOMINAL HYSTERECTOMY     partial- still has ovaries  . BREAST BIOPSY Left 06/01/2019   [place] bx mass at 1:00, venus marker, path pending  . BREAST BIOPSY Left 06/01/2019   [place] bx of LN, coil marker, path pending  . BREAST CYST ASPIRATION Right   . TONSILLECTOMY AND ADENOIDECTOMY       Social History   Socioeconomic History  . Marital status: Single    Spouse name: Not on file  . Number of children: Not on file  . Years of education: Not on file  . Highest education level: Not on file  Occupational History  . Occupation: [employer]   Tobacco Use  . Smoking status: Never Smoker  . Smokeless tobacco: Never Used  Substance and Sexual Activity  . Alcohol use: Never  . Drug use: Never  . Sexual activity: Not on file  Other Topics Concern  . Not on file  Social History Narrative  . Not on file   Social Determinants of Health   Financial Resource Strain:   . Difficulty of Paying Living Expenses:   Food Insecurity:   . Worried About Charity fundraiser in the Last Year:   . Arboriculturist in the Last Year:   Transportation Needs:   . Film/video editor (Medical):   Marland Kitchen Lack of Transportation (Non-Medical):   Physical Activity:   . Days of Exercise per Week:   . Minutes of Exercise per Session:   Stress:   . Feeling of Stress :   Social Connections:   . Frequency of Communication with Friends and Family:   . Frequency of Social Gatherings with Friends and Family:   . Attends  Religious Services:   . Active Member of Clubs or Organizations:   . Attends Archivist Meetings:   Marland Kitchen Marital Status:      FAMILY HISTORY:  We obtained a detailed, 4-generation family history.  Significant diagnoses are listed below: Family History  Problem Relation Age of Onset  . Lung cancer Mother 64       smoker  . Melanoma Mother 38  . Ovarian cancer Sister 44  . Emphysema Father   . COPD Father   . Prostate cancer Father   . Kidney cancer Brother 3  . Cancer Maternal Grandmother        possibly due to [place]  . Cancer Maternal Grandfather        possibly due to [place]   . Heart disease Paternal Grandmother   . Hypertension Paternal Grandmother   . Heart disease Brother   . Heart Problems Maternal Uncle   . Breast cancer Neg Hx      The patient has two brothers and a sister.  Her sister died of ovarian cancer at 31.  One brother was diagnosed with kidney cancer at 40.  Both parents are deceased.  The patient's mother had melanoma two times at 86 and died of lung cancer.  She had one brother who was cancer free.  Her parents died of unknown cancer, possibly due to a [place].  The patient's father had prostate cancer.  He was an only child.  His parents are deceased.  Cynthia Nelson is unaware of previous family history of genetic testing for hereditary cancer risks. Patient's maternal ancestors are of Caucasian descent, and paternal ancestors are of Caucasian descent. There is no reported Ashkenazi Jewish ancestry. There is no known consanguinity.    GENETIC COUNSELING ASSESSMENT: Cynthia Nelson is a 64 y.o. female with a personal and family history of cancer which is somewhat suggestive of a hereditary cancer syndrome and predisposition to cancer given the combination of cancer in the family. We, therefore, discussed and recommended the following at today's visit.   DISCUSSION: We discussed that 5 - 10% of breast cancer is hereditary, with most cases associated with BRCA mutations.  There are other genes that can be associated with hereditary breast cancer syndromes.  These include ATM, CHEK2 and PALB2.  Based on her brother's kidney cancer and her mother's melanoma, she meets criteria for testing for MITF mutations.  We discussed that testing is beneficial for several reasons including knowing how to follow individuals after completing their treatment, and understand if other family members could be at risk for cancer and allow them to undergo genetic testing.   We reviewed the characteristics, features and inheritance patterns of hereditary cancer syndromes. We also discussed genetic testing, including the appropriate family members to test, the process of testing, insurance coverage and turn-around-time for results. We  discussed the implications of a negative, positive, carrier and/or variant of uncertain significant result. We recommended Cynthia Nelson pursue genetic testing for the multi-cancer gene panel. The Multi-Gene Panel offered by [facility] includes sequencing and/or deletion duplication testing of the following 85 genes: AIP, ALK, APC, ATM, AXIN2,BAP1,  BARD1, BLM, BMPR1A, BRCA1, BRCA2, BRIP1, CASR, CDC73, CDH1, CDK4, CDKN1B, CDKN1C, CDKN2A (p14ARF), CDKN2A (p16INK4a), CEBPA, CHEK2, CTNNA1, DICER1, DIS3L2, EGFR (c.2369C>T, p.Thr790Met variant only), EPCAM (Deletion/duplication testing only), FH, FLCN, GATA2, GPC3, GREM1 (Promoter region deletion/duplication testing only), HOXB13 (c.251G>A, p.Gly84Glu), HRAS, KIT, MAX, MEN1, MET, MITF (c.952G>A, p.Glu318Lys variant only), MLH1, MSH2, MSH3, MSH6, MUTYH, NBN, NF1, NF2, NTHL1,  PALB2, PDGFRA, PHOX2B, PMS2, POLD1, POLE, POT1, PRKAR1A, PTCH1, PTEN, RAD50, RAD51C, RAD51D, RB1, RECQL4, RET, RNF43, RUNX1, SDHAF2, SDHA (sequence changes only), SDHB, SDHC, SDHD, SMAD4, SMARCA4, SMARCB1, SMARCE1, STK11, SUFU, TERC, TERT, TMEM127, TP53, TSC1, TSC2, VHL, WRN and WT1.    Based on Cynthia Nelson's personal and family history of cancer, she meets medical criteria for genetic testing. Despite that she meets criteria, she may still have an out of pocket cost. We discussed that if her out of pocket cost for testing is over $100, the laboratory will call and confirm whether she wants to proceed with testing.  If the out of pocket cost of testing is less than $100 she will be billed by the genetic testing laboratory.   PLAN: After considering the risks, benefits, and limitations, Cynthia Nelson provided informed consent to pursue genetic testing and the blood sample was sent to [facility] for analysis of the multi-cancer panel. Results should be available within approximately 2-3 weeks' time, at which point they will be disclosed by telephone to Cynthia Nelson, as will any additional  recommendations warranted by these results. Cynthia Nelson will receive a summary of her genetic counseling visit and a copy of her results once available. This information will also be available in Epic.   Lastly, we encouraged Cynthia Nelson to remain in contact with cancer genetics annually so that we can continuously update the family history and inform her of any changes in cancer genetics and testing that may be of benefit for this family.   Cynthia Nelson questions were answered to her satisfaction today. Our contact information was provided should additional questions or concerns arise. Thank you for the referral and allowing [place] to share in the care of your patient.   Markeese Boyajian P. Florene Glen, [place], [facility] Licensed, Insurance risk surveyor [email or web address] phone: [phone]  The patient was seen for a total of 45 minutes in face-to-face genetic counseling.  This patient was discussed with Drs. Magrinat, Lindi Adie and/or Burr Medico who agrees with the above.    _______________________________________________________________________ For Office Staff:  Number of people involved in session: 1 Was an Intern/ student involved with case: no

## 2019-06-14 LAB — PROTEIN ELECTROPHORESIS, SERUM
A/G Ratio: 1.4 (ref 0.7–1.7)
Albumin ELP: 3.9 g/dL (ref 2.9–4.4)
Alpha-1-Globulin: 0.2 g/dL (ref 0.0–0.4)
Alpha-2-Globulin: 0.7 g/dL (ref 0.4–1.0)
Beta Globulin: 1.1 g/dL (ref 0.7–1.3)
Gamma Globulin: 0.8 g/dL (ref 0.4–1.8)
Globulin, Total: 2.8 g/dL (ref 2.2–3.9)
Total Protein ELP: 6.7 g/dL (ref 6.0–8.5)

## 2019-06-14 LAB — PTH, INTACT AND CALCIUM
Calcium, Total (PTH): 11.3 mg/dL — ABNORMAL HIGH (ref 8.7–10.3)
PTH: 53 pg/mL (ref 15–65)

## 2019-06-14 LAB — ERYTHROPOIETIN: Erythropoietin: 7.1 m[IU]/mL (ref 2.6–18.5)

## 2019-06-16 ENCOUNTER — Encounter: Admission: RE | Payer: Self-pay | Source: Home / Self Care

## 2019-06-16 ENCOUNTER — Ambulatory Visit
Admission: RE | Admit: 2019-06-16 | Payer: No Typology Code available for payment source | Source: Home / Self Care | Admitting: Surgery

## 2019-06-16 ENCOUNTER — Ambulatory Visit: Payer: No Typology Code available for payment source

## 2019-06-16 ENCOUNTER — Ambulatory Visit: Admission: RE | Admit: 2019-06-16 | Payer: No Typology Code available for payment source | Source: Ambulatory Visit

## 2019-06-16 SURGERY — BREAST LUMPECTOMY,RADIO FREQ LOCALIZER,AXILLARY SENTINEL LYMPH NODE BIOPSY
Anesthesia: General | Laterality: Left

## 2019-06-18 LAB — PTH-RELATED PEPTIDE: PTH-related peptide: <2 pmol/L

## 2019-06-20 ENCOUNTER — Encounter: Payer: Self-pay | Admitting: Oncology

## 2019-06-20 DIAGNOSIS — Z7189 Other specified counseling: Secondary | ICD-10-CM | POA: Insufficient documentation

## 2019-06-21 ENCOUNTER — Encounter: Payer: Self-pay | Admitting: Genetic Counselor

## 2019-06-21 DIAGNOSIS — Z1379 Encounter for other screening for genetic and chromosomal anomalies: Secondary | ICD-10-CM | POA: Insufficient documentation

## 2019-06-21 LAB — CALR + JAK2 E12-15 + MPL (REFLEXED)

## 2019-06-21 LAB — JAK2 V617F, W REFLEX TO CALR/E12/MPL

## 2019-06-23 ENCOUNTER — Telehealth: Payer: Self-pay | Admitting: Genetic Counselor

## 2019-06-23 ENCOUNTER — Ambulatory Visit: Payer: Self-pay | Admitting: Genetic Counselor

## 2019-06-23 DIAGNOSIS — Z1379 Encounter for other screening for genetic and chromosomal anomalies: Secondary | ICD-10-CM

## 2019-06-23 DIAGNOSIS — C50412 Malignant neoplasm of upper-outer quadrant of left female breast: Secondary | ICD-10-CM

## 2019-06-23 NOTE — Telephone Encounter (Signed)
LM on VM that results are back and to please call. 

## 2019-06-23 NOTE — Progress Notes (Signed)
HPI:  Ms. Starrett was previously seen in the [place] clinic due to a personal and family history of cancer and concerns regarding a hereditary predisposition to cancer. Please refer to our prior cancer genetics clinic note for more information regarding our discussion, assessment and recommendations, at the time. Ms. Graul recent genetic test results were disclosed to her, as were recommendations warranted by these results. These results and recommendations are discussed in more detail below.  CANCER HISTORY:  Oncology History  Malignant neoplasm of upper-outer quadrant of left breast in female, estrogen receptor positive ([place])  06/08/2019 Initial Diagnosis   Malignant neoplasm of upper-outer quadrant of left breast in female, estrogen receptor positive ([place])   06/20/2019 Genetic Testing   Negative genetic testing on the multi-cancer panel.  HOXB13 c.649C>T (p.Arg217Cys) and POLE c.1879G>A (p.Val627Met) VUS identified. The Multi-Gene Panel offered by [facility] includes sequencing and/or deletion duplication testing of the following 85 genes: AIP, ALK, APC, ATM, AXIN2,BAP1,  BARD1, BLM, BMPR1A, BRCA1, BRCA2, BRIP1, CASR, CDC73, CDH1, CDK4, CDKN1B, CDKN1C, CDKN2A (p14ARF), CDKN2A (p16INK4a), CEBPA, CHEK2, CTNNA1, DICER1, DIS3L2, EGFR (c.2369C>T, p.Thr790Met variant only), EPCAM (Deletion/duplication testing only), FH, FLCN, GATA2, GPC3, GREM1 (Promoter region deletion/duplication testing only), HOXB13 (c.251G>A, p.Gly84Glu), HRAS, KIT, MAX, MEN1, MET, MITF (c.952G>A, p.Glu318Lys variant only), MLH1, MSH2, MSH3, MSH6, MUTYH, NBN, NF1, NF2, NTHL1, PALB2, PDGFRA, PHOX2B, PMS2, POLD1, POLE, POT1, PRKAR1A, PTCH1, PTEN, RAD50, RAD51C, RAD51D, RB1, RECQL4, RET, RNF43, RUNX1, SDHAF2, SDHA (sequence changes only), SDHB, SDHC, SDHD, SMAD4, SMARCA4, SMARCB1, SMARCE1, STK11, SUFU, TERC, TERT, TMEM127, TP53, TSC1, TSC2, VHL, WRN and WT1.  The report date is June 20, 2019.     FAMILY HISTORY:  We  obtained a detailed, 4-generation family history.  Significant diagnoses are listed below: Family History  Problem Relation Age of Onset  . Lung cancer Mother 87       smoker  . Melanoma Mother 34  . Ovarian cancer Sister 49  . Emphysema Father   . COPD Father   . Prostate cancer Father   . Kidney cancer Brother 44  . Cancer Maternal Grandmother        possibly due to [place]  . Cancer Maternal Grandfather        possibly due to [place]   . Heart disease Paternal Grandmother   . Hypertension Paternal Grandmother   . Heart disease Brother   . Heart Problems Maternal Uncle   . Breast cancer Neg Hx     The patient has two brothers and a sister.  Her sister died of ovarian cancer at 45.  One brother was diagnosed with kidney cancer at 53.  Both parents are deceased.  The patient's mother had melanoma two times at 71 and died of lung cancer.  She had one brother who was cancer free.  Her parents died of unknown cancer, possibly due to a [place].  The patient's father had prostate cancer.  He was an only child.  His parents are deceased.  Ms. Giannone is unaware of previous family history of genetic testing for hereditary cancer risks. Patient's maternal ancestors are of Caucasian descent, and paternal ancestors are of Caucasian descent. There is no reported Ashkenazi Jewish ancestry. There is no known consanguinity.     GENETIC TEST RESULTS: Genetic testing reported out on June 20, 2019 through the multi-cancer panel found no pathogenic mutations. The Multi-Gene Panel offered by [facility] includes sequencing and/or deletion duplication testing of the following 85 genes: AIP, ALK, APC, ATM, AXIN2,BAP1,  BARD1,  BLM, BMPR1A, BRCA1, BRCA2, BRIP1, CASR, CDC73, CDH1, CDK4, CDKN1B, CDKN1C, CDKN2A (p14ARF), CDKN2A (p16INK4a), CEBPA, CHEK2, CTNNA1, DICER1, DIS3L2, EGFR (c.2369C>T, p.Thr790Met variant only), EPCAM (Deletion/duplication testing only), FH, FLCN, GATA2, GPC3, GREM1  (Promoter region deletion/duplication testing only), HOXB13 (c.251G>A, p.Gly84Glu), HRAS, KIT, MAX, MEN1, MET, MITF (c.952G>A, p.Glu318Lys variant only), MLH1, MSH2, MSH3, MSH6, MUTYH, NBN, NF1, NF2, NTHL1, PALB2, PDGFRA, PHOX2B, PMS2, POLD1, POLE, POT1, PRKAR1A, PTCH1, PTEN, RAD50, RAD51C, RAD51D, RB1, RECQL4, RET, RNF43, RUNX1, SDHAF2, SDHA (sequence changes only), SDHB, SDHC, SDHD, SMAD4, SMARCA4, SMARCB1, SMARCE1, STK11, SUFU, TERC, TERT, TMEM127, TP53, TSC1, TSC2, VHL, WRN and WT1.  The test report has been scanned into EPIC and is located under the Molecular Pathology section of the Results Review tab.  A portion of the result report is included below for reference.     We discussed with Ms. Rehmann that because current genetic testing is not perfect, it is possible there may be a gene mutation in one of these genes that current testing cannot detect, but that chance is small.  We also discussed, that there could be another gene that has not yet been discovered, or that we have not yet tested, that is responsible for the cancer diagnoses in the family. It is also possible there is a hereditary cause for the cancer in the family that Ms. Prehn did not inherit and therefore was not identified in her testing.  Therefore, it is important to remain in touch with cancer genetics in the future so that we can continue to offer Ms. Fanara the most up to date genetic testing.   Genetic testing did identify two Variants of uncertain significance (VUS) - one in the HOXB13 gene called c.649C>T and a second in the POLE gene called c.1879G>A.  At this time, it is unknown if these variants are associated with increased cancer risk or if they are normal findings, but most variants such as these get reclassified to being inconsequential. They should not be used to make medical management decisions. With time, we suspect the lab will determine the significance of these variants, if any. If we do learn more about them,  we will try to contact Ms. Gutmann to discuss it further. However, it is important to stay in touch with [place] periodically and keep the address and phone number up to date.  ADDITIONAL GENETIC TESTING: We discussed with Ms. Lycan that there are other genes that are associated with increased cancer risk that can be analyzed. Should Ms. Taplin wish to pursue additional genetic testing, we are happy to discuss and coordinate this testing, at any time.    CANCER SCREENING RECOMMENDATIONS: Ms. Friesen's test result is considered negative (normal).  This means that we have not identified a hereditary cause for her personal and family history of cancer at this time. Most cancers happen by chance and this negative test suggests that her cancer may fall into this category.    While reassuring, this does not definitively rule out a hereditary predisposition to cancer. It is still possible that there could be genetic mutations that are undetectable by current technology. There could be genetic mutations in genes that have not been tested or identified to increase cancer risk.  Therefore, it is recommended she continue to follow the cancer management and screening guidelines provided by her oncology and primary healthcare provider.   An individual's cancer risk and medical management are not determined by genetic test results alone. Overall cancer risk assessment incorporates additional factors, including personal medical history,  family history, and any available genetic information that may result in a personalized plan for cancer prevention and surveillance  RECOMMENDATIONS FOR FAMILY MEMBERS:  Individuals in this family might be at some increased risk of developing cancer, over the general population risk, simply due to the family history of cancer.  We recommended women in this family have a yearly mammogram beginning at age 77, or 76 years younger than the earliest onset of cancer, an annual clinical breast  exam, and perform monthly breast self-exams. Women in this family should also have a gynecological exam as recommended by their primary provider. All family members should have a colonoscopy by age 43.  FOLLOW-UP: Lastly, we discussed with Ms. Cromley that cancer genetics is a rapidly advancing field and it is possible that new genetic tests will be appropriate for her and/or her family members in the future. We encouraged her to remain in contact with cancer genetics on an annual basis so we can update her personal and family histories and let her know of advances in cancer genetics that may benefit this family.   Our contact number was provided. Ms. Godlewski's questions were answered to her satisfaction, and she knows she is welcome to call us at anytime with additional questions or concerns.   Roma Kayser, [place], [facility] Licensed, Certified Genetic Counselor [email or web address]

## 2019-06-23 NOTE — Telephone Encounter (Signed)
Revealed negative genetic testing.  Discussed that we do not know why she has breast cancer or why there is cancer in the family. It could be due to a different gene that we are not testing, or maybe our current technology may not be able to pick something up.  It will be important for her to keep in contact with genetics to keep up with whether additional testing may be needed.  Two VUS that will not change medical management were identified.

## 2019-06-24 ENCOUNTER — Other Ambulatory Visit: Payer: Self-pay | Admitting: Dermatology

## 2019-06-25 NOTE — Addendum Note (Signed)
Addended by: Earlie Server on: 06/25/2019 04:52 PM   Modules accepted: Orders

## 2019-06-26 ENCOUNTER — Telehealth: Payer: Self-pay

## 2019-06-26 NOTE — Telephone Encounter (Signed)
Done...  CT has been scheduled as requested.. Called pt and made her aware of the scheduled CT Abd/Pelvis [facility]IC-CT location, date and time of her appt. Prep pick up before appt date/npo 4hrs(pt is aware)

## 2019-06-26 NOTE — Telephone Encounter (Signed)
-----   Message from Earlie Server, MD sent at 06/25/2019  4:52 PM EDT ----- Please arrange patient to have CT chest abdomen pelvis done.  She knows the plan.

## 2019-07-05 ENCOUNTER — Telehealth: Payer: Self-pay | Admitting: *Deleted

## 2019-07-05 NOTE — Telephone Encounter (Signed)
Per Margreta Journey to -Cx 07/07/19 CT c/a/p ASAP due to pt having Medicaid and pending auth. CT Abd/Pelvis as been cx as requested  I called and left a detailled message on pts vmail making her aware that her scheduled 6/18 CT had been cx and someone will notify her once it's approved with a New scheduled date and time.

## 2019-07-07 ENCOUNTER — Ambulatory Visit: Admission: RE | Admit: 2019-07-07 | Payer: No Typology Code available for payment source | Source: Ambulatory Visit

## 2019-07-13 ENCOUNTER — Telehealth: Payer: Self-pay | Admitting: Surgery

## 2019-07-13 ENCOUNTER — Telehealth: Payer: Self-pay | Admitting: *Deleted

## 2019-07-13 NOTE — Telephone Encounter (Signed)
Al Pimple from the cancer center called in regards to this patient, she stated that she was suppose to have a CT scan done prior to scheduling surgery but it was Denied, Webb Silversmith stated that Dr Tasia Catchings doesnt think its needed anyway that we can go ahead to proceed with surgery.  Dr Christian Mate was sent a message through secure chat and is aware

## 2019-07-13 NOTE — Telephone Encounter (Signed)
Updated surgery information, patient has been advised of Pre-Admission date/time, COVID Testing date and Surgery date.  Surgery Date: 07/31/19 Preadmission Testing Date: 07/25/19 (phone 8a-1p) Covid Testing Date: 7/8//21 - patient advised to go to the [facility] ([place]) between 8a-1p   Patient is informed that she needs to arrive day of surgery at 7:45 am for [facility]N Bx at [facility] and from there will be taken to day surgery.  Patient voices understanding.

## 2019-07-20 HISTORY — PX: BREAST LUMPECTOMY: SHX2

## 2019-07-25 ENCOUNTER — Ambulatory Visit: Payer: Self-pay | Admitting: Surgery

## 2019-07-25 ENCOUNTER — Encounter
Admission: RE | Admit: 2019-07-25 | Discharge: 2019-07-25 | Disposition: A | Discharge status: Home / Self Care | Payer: No Typology Code available for payment source | Source: Ambulatory Visit | Attending: Surgery | Admitting: Surgery

## 2019-07-25 ENCOUNTER — Other Ambulatory Visit: Payer: Self-pay

## 2019-07-25 DIAGNOSIS — Z170 Estrogen receptor positive status [ER+]: Secondary | ICD-10-CM

## 2019-07-25 DIAGNOSIS — C50412 Malignant neoplasm of upper-outer quadrant of left female breast: Secondary | ICD-10-CM

## 2019-07-25 DIAGNOSIS — I10 Essential (primary) hypertension: Secondary | ICD-10-CM

## 2019-07-25 HISTORY — DX: Essential (primary) hypertension: I10

## 2019-07-25 NOTE — Patient Instructions (Signed)
Your procedure is scheduled on: 07/31/19 Report to [facility].  Please call [phone] FOR ANY QUESTIONS  Remember: Instructions that are not followed completely may result in serious medical risk, up to and including death, or upon the discretion of your surgeon and anesthesiologist your surgery may need to be rescheduled.     _X__ 1. Do not eat food after midnight the night before your procedure.                 No gum chewing or hard candies. You may drink clear liquids up to 2 hours                 before you are scheduled to arrive for your surgery- DO not drink clear                 liquids within 2 hours of the start of your surgery.                 Clear Liquids include:  water, apple juice without pulp, clear carbohydrate                 drink such as Clearfast or Gatorade, Black Coffee or Tea (Do not add                 anything to coffee or tea). Diabetics water only  __X__2.  On the morning of surgery brush your teeth with toothpaste and water, you                 may rinse your mouth with mouthwash if you wish.  Do not swallow any              toothpaste of mouthwash.     _X__ 3.  No Alcohol for 24 hours before or after surgery.   _X__ 4.  Do Not Smoke or use e-cigarettes For 24 Hours Prior to Your Surgery.                 Do not use any chewable tobacco products for at least 6 hours prior to                 surgery.  ____  5.  Bring all medications with you on the day of surgery if instructed.   __X__  6.  Notify your doctor if there is any change in your medical condition      (cold, fever, infections).     Do not wear jewelry, make-up, hairpins, clips or nail polish. Do not wear lotions, powders, or perfumes.  Do not shave 48 hours prior to surgery. Men may shave face and neck. Do not bring valuables to the hospital.    [facility] is not responsible for any belongings or valuables.  Contacts, dentures/partials or body  piercings may not be worn into surgery. Bring a case for your contacts, glasses or hearing aids, a denture cup will be supplied. Leave your suitcase in the car. After surgery it may be brought to your room. For patients admitted to the hospital, discharge time is determined by your treatment team.   Patients discharged the day of surgery will not be allowed to drive home.   Please read over the following fact sheets that you were given:   MRSA Information  __X__ Take these medicines the morning of surgery with A SIP OF WATER:    1. atorvastatin (LIPITOR) 20 MG tablet  2.  3.   4.  5.  6.  ____ Fleet Enema (as directed)   __X__ Use CHG Soap/SAGE wipes as directed  ____ Use inhalers on the day of surgery  ____ Stop metformin/Janumet/Farxiga 2 days prior to surgery    ____ Take 1/2 of usual insulin dose the night before surgery. No insulin the morning          of surgery.   ____ Stop Blood Thinners Coumadin/Plavix/Xarelto/Pleta/Pradaxa/Eliquis/Effient/Aspirin  on   Or contact your Surgeon, Cardiologist or Medical Doctor regarding  ability to stop your blood thinners  __X__ Stop Anti-inflammatories 7 days before surgery such as Advil, Ibuprofen, Motrin,  BC or Goodies Powder, Naprosyn, Naproxen, Aleve, Aspirin    __X__ Stop all herbal supplements, fish oil or vitamin E until after surgery.    ____ Bring C-Pap to the hospital.

## 2019-07-27 ENCOUNTER — Other Ambulatory Visit: Payer: No Typology Code available for payment source

## 2019-07-27 ENCOUNTER — Other Ambulatory Visit: Payer: Self-pay

## 2019-07-27 ENCOUNTER — Other Ambulatory Visit
Admission: RE | Admit: 2019-07-27 | Discharge: 2019-07-27 | Disposition: A | Discharge status: Home / Self Care | Payer: No Typology Code available for payment source | Source: Ambulatory Visit | Attending: Surgery | Admitting: Surgery

## 2019-07-27 ENCOUNTER — Encounter: Payer: Self-pay | Admitting: Surgery

## 2019-07-27 ENCOUNTER — Ambulatory Visit (INDEPENDENT_AMBULATORY_CARE_PROVIDER_SITE_OTHER): Payer: No Typology Code available for payment source | Admitting: Surgery

## 2019-07-27 VITALS — BP 197/114 | HR 84 | Temp 97.6°F | Resp 96 | Ht 61.0 in | Wt 143.0 lb

## 2019-07-27 DIAGNOSIS — Z01812 Encounter for preprocedural laboratory examination: Secondary | ICD-10-CM | POA: Diagnosis not present

## 2019-07-27 DIAGNOSIS — Z170 Estrogen receptor positive status [ER+]: Secondary | ICD-10-CM

## 2019-07-27 DIAGNOSIS — C50412 Malignant neoplasm of upper-outer quadrant of left female breast: Secondary | ICD-10-CM | POA: Diagnosis not present

## 2019-07-27 DIAGNOSIS — Z20822 Contact with and (suspected) exposure to covid-19: Secondary | ICD-10-CM | POA: Diagnosis not present

## 2019-07-27 LAB — COMPREHENSIVE METABOLIC PANEL
ALT: 16 U/L (ref 0–44)
AST: 24 U/L (ref 15–41)
Albumin: 4.3 g/dL (ref 3.5–5.0)
Alkaline Phosphatase: 66 U/L (ref 38–126)
Anion gap: 11 (ref 5–15)
BUN: 12 mg/dL (ref 8–23)
CO2: 24 mmol/L (ref 22–32)
Calcium: 11 mg/dL — ABNORMAL HIGH (ref 8.9–10.3)
Chloride: 105 mmol/L (ref 98–111)
Creatinine, Ser: 0.81 mg/dL (ref 0.44–1.00)
GFR calc Af Amer: >60 mL/min (ref >60)
GFR calc non Af Amer: >60 mL/min (ref >60)
Glucose, Bld: 117 mg/dL — ABNORMAL HIGH (ref 70–99)
Potassium: 3.3 mmol/L — ABNORMAL LOW (ref 3.5–5.1)
Sodium: 140 mmol/L (ref 135–145)
Total Bilirubin: 0.8 mg/dL (ref 0.3–1.2)
Total Protein: 7.3 g/dL (ref 6.5–8.1)

## 2019-07-27 LAB — CBC WITH DIFFERENTIAL/PLATELET
Abs Immature Granulocytes: 0.02 10*3/uL (ref 0.00–0.07)
Basophils Absolute: 0.1 10*3/uL (ref 0.0–0.1)
Basophils Relative: 1 %
Eosinophils Absolute: 0.2 10*3/uL (ref 0.0–0.5)
Eosinophils Relative: 4 %
HCT: 46.2 % — ABNORMAL HIGH (ref 36.0–46.0)
Hemoglobin: 16.5 g/dL — ABNORMAL HIGH (ref 12.0–15.0)
Immature Granulocytes: 0 %
Lymphocytes Relative: 31 %
Lymphs Abs: 2 10*3/uL (ref 0.7–4.0)
MCH: 33 pg (ref 26.0–34.0)
MCHC: 35.7 g/dL (ref 30.0–36.0)
MCV: 92.4 fL (ref 80.0–100.0)
Monocytes Absolute: 0.6 10*3/uL (ref 0.1–1.0)
Monocytes Relative: 9 %
Neutro Abs: 3.7 10*3/uL (ref 1.7–7.7)
Neutrophils Relative %: 55 %
Platelets: 312 10*3/uL (ref 150–400)
RBC: 5 MIL/uL (ref 3.87–5.11)
RDW: 12.3 % (ref 11.5–15.5)
WBC: 6.6 10*3/uL (ref 4.0–10.5)
nRBC: 0 % (ref 0.0–0.2)

## 2019-07-27 LAB — SARS CORONAVIRUS 2 (TAT 6-24 HRS): SARS Coronavirus 2: NEGATIVE

## 2019-07-27 NOTE — Progress Notes (Signed)
Patient ID: Cynthia Nelson, female   DOB: 05-16-1955, 64 y.o.   MRN: [id number]  Chief Complaint:  Left breast mass  History of Present Illness Interval note: After extensive work-up and chasing down additional other abnormalities and screening for extent of disease, we have confirmed there is no additional cancer to pursue at this time.  Previously: Cynthia Nelson is a 64 y.o. female with a palpable left breast mass of 2 mos. she had not noted any change over the last 2 months.  She has no history of nipple discharge, breast skin changes or breast pain.  She reports she does breast self-examinations at times.  She identified the mass herself.  She has a history of birth control, hysterectomy in 2005.  Denies any family history of breast cancer.  She has never been pregnant.  She had a Covid vaccination in her left arm which might be responsible for a slightly thickened cortex of some level 2 lymph nodes which were biopsied but came back negative.  The breast lesion is 7 cm from the nipple and its largest dimension is 1.5 cm.  Core biopsy revealed a 9 mm of tissue consistent with an invasive lobular carcinoma with associated atypical lobular hyperplasia.  She is ER/PR positive and HER-2 is pending on fish examination.  Past Medical History Past Medical History:  Diagnosis Date  . Family history of kidney cancer   . Family history of melanoma   . Family history of ovarian cancer   . Family history of prostate cancer   . Hyperlipidemia   . Hypertension 07/25/2019   Pt states hx white coat. BP only elevated at MD office.  . Psoriasis       Past Surgical History:  Procedure Laterality Date  . ABDOMINAL HYSTERECTOMY     partial- still has ovaries  . BREAST BIOPSY Left 06/01/2019   [place] bx mass at 1:00, venus marker, path pending  . BREAST BIOPSY Left 06/01/2019   [place] bx of LN, coil marker, path pending  . BREAST CYST ASPIRATION Right   . TONSILLECTOMY AND ADENOIDECTOMY      Allergies   Allergen Reactions  . Codeine Nausea And Vomiting    Current Outpatient Medications  Medication Sig Dispense Refill  . acetaminophen (TYLENOL) 500 MG tablet Take 500 mg by mouth daily as needed for moderate pain or headache.     Marland Kitchen atorvastatin (LIPITOR) 20 MG tablet Take 1 tablet (20 mg total) by mouth daily. 90 tablet 1  . Chlorpheniramine-DM (CORICIDIN HBP COUGH/COLD PO) Take 1 tablet by mouth daily as needed (congestion).     Marland Kitchen diclofenac sodium (VOLTAREN) 1 % GEL Apply 2 g topically 4 (four) times daily. (Patient taking differently: Apply 2 g topically 4 (four) times daily as needed. ) 100 g 1  . OTEZLA 30 MG TABS TAKE 1 TABLET BY MOUTH TWICE DAILY (Patient taking differently: Take 1 tablet by mouth in the morning and at bedtime. ) 180 tablet 0   No current facility-administered medications for this visit.    Family History Family History  Problem Relation Age of Onset  . Lung cancer Mother 49       smoker  . Melanoma Mother 64  . Ovarian cancer Sister 85  . Emphysema Father   . COPD Father   . Prostate cancer Father   . Kidney cancer Brother 8  . Cancer Maternal Grandmother        possibly due to [place]  . Cancer Maternal Grandfather  possibly due to [place]   . Heart disease Paternal Grandmother   . Hypertension Paternal Grandmother   . Heart disease Brother   . Heart Problems Maternal Uncle   . Breast cancer Neg Hx       Social History Social History   Tobacco Use  . Smoking status: Never Smoker  . Smokeless tobacco: Never Used  Vaping Use  . Vaping Use: Never used  Substance Use Topics  . Alcohol use: Never  . Drug use: Never        Review of Systems  Constitutional: Negative for weight loss.  HENT: Negative.   Eyes: Negative.   Respiratory: Negative for shortness of breath.   Cardiovascular: Negative for chest pain.  Gastrointestinal: Negative for abdominal pain.  Genitourinary: Negative.   Musculoskeletal: Negative for back  pain, joint pain and neck pain.  Skin: Negative.   Neurological: Negative.   Endo/Heme/Allergies: Negative.       Physical Exam Blood pressure (!) 197/114, pulse 84, temperature 97.6 F (36.4 C), temperature source Oral, resp. rate (!) 96, height 5' 1" (1.549 m), weight 143 lb (64.9 kg), head circumference 12" (30.5 cm). Last Weight  Most recent update: 07/27/2019 11:18 AM   Weight  64.9 kg (143 lb)            CONSTITUTIONAL: Well developed, and nourished, appropriately responsive and aware without distress.   EYES: Sclera non-icteric.   EARS, NOSE, MOUTH AND THROAT: Mask worn.     Hearing is intact to voice.  NECK: Trachea is midline, and there is no jugular venous distension.  LYMPH NODES:  Lymph nodes in the neck are not enlarged. RESPIRATORY:  Lungs are clear, and breath sounds are equal bilaterally. Normal respiratory effort without pathologic use of accessory muscles. CARDIOVASCULAR: Heart is regular in rate and rhythm. GI: The abdomen is  soft, nontender, and nondistended. There were no palpable masses. GU: Left breast with vague UOQ density with ecchymosis, mobile breast quadrant.   MUSCULOSKELETAL:  Symmetrical muscle tone appreciated in all four extremities.    SKIN: Skin turgor is normal. No pathologic skin lesions appreciated.  NEUROLOGIC:  Motor and sensation appear grossly normal.  Cranial nerves are grossly without defect. PSYCH:  Alert and oriented to person, place and time. Affect is appropriate for situation.  Data Reviewed I have personally reviewed what is currently available of the patient's imaging, recent labs and medical records.   Labs:  CBC Latest Ref Rng & Units 06/08/2019 05/29/2019  WBC 4.0 - 10.5 K/uL 8.3 6.1  Hemoglobin 12.0 - 15.0 g/dL 16.8(H) 16.4(H)  Hematocrit 36 - 46 % 48.2(H) 46.1  Platelets 150 - 400 K/uL 315 284   CMP Latest Ref Rng & Units 06/13/2019 06/08/2019 05/29/2019  Glucose 70 - 99 mg/dL - 99 80  BUN 8 - 23 mg/dL - 19 13  Creatinine  0.44 - 1.00 mg/dL - 0.80 0.84  Sodium 135 - 145 mmol/L - 137 139  Potassium 3.5 - 5.1 mmol/L - 3.7 4.5  Chloride 98 - 111 mmol/L - 103 99  CO2 22 - 32 mmol/L - 25 26  Calcium 8.7 - 10.3 mg/dL 11.3(H) 10.6(H) 10.7(H)  Total Protein 6.5 - 8.1 g/dL - 7.6 6.8  Total Bilirubin 0.3 - 1.2 mg/dL - 0.6 0.4  Alkaline Phos 38 - 126 U/L - 74 89  AST 15 - 41 U/L - 23 25  ALT 0 - 44 U/L - 21 18   SURGICAL PATHOLOGY  * THIS IS AN ADDENDUM   REPORT *  CASE: [id number]  PATIENT: Cynthia Nelson  Surgical Pathology Report  *Addendum *   Reason for Addendum #1: Breast Biomarker Results   Specimen Submitted:  A. Breast, left  B. Axilla, left   Clinical History: 63 year old female with 1.7 cm palpable mass in the 1  o'clock position of the left breast and abnormal cortical thickening of  left axillary lymph node. The patient had 2nd Moderna vaccine left arm  04-28-19. 1. IMC 2. Metastatic vs reactive LN; a Venus shaped tissue  marker clip was deployed       DIAGNOSIS:  A. BREAST, LEFT 1:00 7 CM FN; ULTRASOUND-GUIDED BIOPSY:  - INVASIVE LOBULAR CARCINOMA, CLASSIC TYPE.  - ATYPICAL LOBULAR HYPERPLASIA.  Size of invasive carcinoma: 9 mm in this sample  Histologic grade of invasive carcinoma: Grade 2            Glandular/tubular differentiation score: 3            Nuclear pleomorphism score: 2            Mitotic rate score:1            Total score: 6  Ductal carcinoma in situ: Not identified  Lymphovascular invasion: Not identified   B. LYMPH NODE, LEFT AXILLA; ULTRASOUND-GUIDED BIOPSY:  - LYMPH NODE, NEGATIVE FOR METASTATIC CARCINOMA.  - DEEPER SECTIONS WERE EXAMINED.   Comment:  The definitive grade will be assigned on the excisional specimen.  ER/PR/HER2 immunohistochemistry will be performed on block A1, with  reflex to FISH for HER2 2+. The results will be reported in an addendum.   GROSS DESCRIPTION:  A. Labeled: Left breast 1:00 7  cm from nipple  Received: Formalin  Time/date in fixative: Collected and placed into formalin at 3:24 PM on  06/01/2019.  Cold ischemic time: Less than 1 minute  Total fixation time: 6 hours  Core pieces: 3  Size: Ranging from 0.5-1.3 cm in length and 0.2 cm in diameter  Description: Received are needle core biopsy fragments of tan-yellow,  fibrofatty tissue  Ink color: Blue  Entirely submitted in cassette 1.   B. Labeled: Left axilla lymph node  Received: Formalin  Time/date in fixative: Collected and placed into formalin at 3:33 PM on  06/01/2019.  Cold ischemic time: Less than 1 minute  Total fixation time: 6 hours  Core pieces: 3  Size: Ranging from 0.7-1.2 cm in length and 0.2 cm in diameter  Description: Received are needle core biopsy fragments of tan-yellow  fibrofatty tissue  Ink color: Black  Entirely submitted in 1 cassette.      Final Diagnosis performed by Tara Rubinas, MD.  Electronically signed  06/05/2019 1:12:50PM  The electronic signature indicates that the named Attending Pathologist  has evaluated the specimen  Technical component performed at [facility], [address], [place],  [place] [address] Lab: [phone] Dir: Sanjai Nagendra, MD, MMM  Professional component performed at [facility], [facility], [address], [place], [place] [address] Lab: [phone]  Dir: Tara C. Rubinas, MD   ADDENDUM:  BREAST BIOMARKER TESTS  Estrogen Receptor (ER) Status: POSITIVE            Percentage of cells with nuclear positivity:> 90%            Average intensity of staining: Strong   Progesterone Receptor (PgR) Status: POSITIVE            Percentage of cells with nuclear positivity: 1-10%              Average intensity of staining: Moderate   HER2 (by immunohistochemistry): EQUIVOCAL (Score 2+)            Percentage of cells with uniform intense complete  membrane staining: 10%  HER2 FISH  will be performed and reported in an addendum.   Cold Ischemia and Fixation Times: Meet requirements specified in latest  version of the ASCO/CAP guidelines  Testing Performed on Block Number(s): A1    Imaging: Radiology review:   CLINICAL DATA:  63-year-old presenting with a palpable lump in the UPPER OUTER QUADRANT of the LEFT breast which she initially noted approximately 2 months ago without interval change. Annual evaluation, RIGHT breast.  The patient recently found new employment and therefore has medical insurance which she did not have at the time she found a lump approximately 2 months ago. She also states that she had both doses of the Moderna COVID-19 vaccine in the LEFT arm, the second dose on 04/28/2019.  EXAM: DIGITAL DIAGNOSTIC BILATERAL MAMMOGRAM WITH CAD AND TOMO  ULTRASOUND LEFT BREAST  COMPARISON:  Previous exam(s).  ACR Breast Density Category b: There are scattered areas of fibroglandular density.  FINDINGS: Tomosynthesis and synthesized full field CC and MLO views of both breasts were obtained. Tomosynthesis and synthesized spot-compression CC and MLO views of the area of concern in the LEFT breast were obtained.  Corresponding to the palpable concern in the UPPER OUTER QUADRANT of the LEFT breast is a hyperdense mass associated with architectural distortion. There are associated faint calcifications in this location. The distortion persists on the spot compression images. No suspicious findings elsewhere in the LEFT breast.  No findings suspicious for malignancy in the RIGHT breast.  Mammographic images were processed with CAD.  On correlative physical exam, there is palpable thickening in the UPPER OUTER QUADRANT of the LEFT breast corresponding to what the patient is feeling.  Targeted LEFT breast ultrasound is performed, showing an irregular hypoechoic mass with vague and angular margins at the 1 o'clock position approximately  7 cm from the nipple at MIDDLE to POSTERIOR depth, measuring approximately 1.5 x 1.0 x 0.9 cm, demonstrating internal power Doppler flow and demonstrating acoustic shadowing, associated with architectural distortion, accounting for the palpable concern.  Sonographic evaluation of the LEFT axilla demonstrates a solitary deep level 2 abnormal lymph node demonstrating cortical thickening up to approximately 9 mm and power Doppler flow within the thickened cortex.  IMPRESSION: 1. Highly suspicious 1.5 cm mass involving the UPPER OUTER QUADRANT of the LEFT breast, associated with architectural distortion, accounting for the palpable concern. 2. Solitary abnormal LEFT axillary lymph node demonstrating cortical thickening up to 9 mm. (As the patient had the second dose of her COVID-19 vaccine in the LEFT arm 4 weeks ago, this may represent a reactive lymph node).  RECOMMENDATION: Ultrasound-guided core needle biopsy of the LEFT breast mass and the solitary abnormal LEFT axillary lymph node.  The ultrasound core needle biopsy procedures were discussed with the patient and her questions were answered. She wishes to proceed and she will be contacted by the Breast Navigator at the [facility] in order to schedule the biopsy.  I have discussed the findings and recommendations with the patient.  BI-RADS CATEGORY  5: Highly suggestive of malignancy.   Electronically Signed   By: Thomas  Lawrence M.D.   On: 05/29/2019 11:24   CLINICAL DATA:  63-year-old female with recently diagnosed invasive lobular left breast cancer.  LABS:  None  EXAM: BILATERAL BREAST MRI WITH AND WITHOUT CONTRAST    TECHNIQUE: Multiplanar, multisequence MR images of both breasts were obtained prior to and following the intravenous administration of 6 ml of Gadavist  Three-dimensional MR images were rendered by post-processing of the original MR data on an independent workstation.  The three-dimensional MR images were interpreted, and findings are reported in the following complete MRI report for this study. Three dimensional images were evaluated at the independent DynaCad workstation  COMPARISON:  Previous exam(s).  FINDINGS: Breast composition: b. Scattered fibroglandular tissue.  Background parenchymal enhancement: Mild  Right breast: No mass or abnormal enhancement.  Left breast: In the upper outer left breast mid/posterior depth there is an irregular spiculated mass measuring 1.3 x 1.0 x 1.4 cm (series 10, image 79) which corresponds to the patient's biopsy proven malignancy. There is associated susceptibility artifact consistent with a biopsy marking clip. There are no additional areas of abnormal enhancement in the left breast.  There are multiple bilateral small foci with benign kinetics, which given similar morphology, multiplicity, and bilaterally, are considered benign.  Lymph nodes: No abnormal appearing lymph nodes.  Ancillary findings:  None.  IMPRESSION: Biopsy proven malignancy in the upper outer left breast measuring 1.3 x 1.0 x 1.4 cm.  RECOMMENDATION: Treatment plan for known left breast malignancy  BI-RADS CATEGORY  BI-RADS CATEGORY 6: Known biopsy-proven malignancy.   Electronically Signed   By: Nancy  Ballantyne M.D.   On: 06/13/2019 16:19   Assessment    Left breast invasive lobular carcinoma, ER/PR +  1.5 cm on U/S.  Patient Active Problem List   Diagnosis Date Noted  . Genetic testing 06/21/2019  . Goals of care, counseling/discussion 06/20/2019  . Family history of ovarian cancer   . Family history of kidney cancer   . Family history of melanoma   . Family history of prostate cancer   . Hypercalcemia 06/09/2019  . Family history of cancer 06/09/2019  . Lobular carcinoma of left breast (HCC) 06/09/2019  . Invasive carcinoma of breast (HCC) 06/09/2019  . Malignant neoplasm of upper-outer quadrant  of left breast in female, estrogen receptor positive (HCC) 06/08/2019  . Essential hypertension 05/29/2019  . Psoriasis   . Juvenile rheumatoid arthritis (HCC) 09/02/2018  . History of duodenal ulcer 09/02/2018  . 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 The risk of recurrence is similar between mastectomy and lumpectomy with radiation.  I also discussed that given the small size of the cancer would recommend localization lumpectomy with radiation to follow.  I also discussed that we would need to do a sentinel lymph node biopsy to check the nodes.  Explained to the patient that after her surgical treatment additional treatment will depend on her prognostic indicators and stage.    I discussed risks of bleeding, infection, damage to surrounding tissues, having positive margins, needing further resection, damage to nerves causing arm numbness or difficulty raising arm, causing lymphedema in the arm; as well as anesthesia risks of MI, stroke, prolonged ventilation, pulmonary embolism, thrombosis and even death.   Patient was given the opportunity to ask questions and have them answered.  They would like to proceed with left breast RFID localized lumpectomy with sentinel lymph node biopsy.       Face-to-face time spent with the patient and accompanying care providers(if present) was 35 minutes, with more than 50% of the time spent counseling, educating, and coordinating care of the patient.      Resha Filippone M.D., FACS 07/27/2019, 11:32   AM     

## 2019-07-27 NOTE — Progress Notes (Signed)
  [facility] Perioperative Services: Pre-Admission/Anesthesia Testing  Abnormal Lab Notification    Date: 07/27/19  Name: Cynthia Nelson MRN:   [id number]  Re: Abnormal labs noted during PAT appointment   Provider Notified: Ronny Bacon, MD Notification mode: Routed via [facility]   ABNORMAL LAB VALUE(S):  K+ 3.3 mmol/L  Calcium 11.0 mg/dL  Notes:  Elevated calcium likely represents malignancy related hypercalcemia. Patient with ER/PR (+) invasive lobular carcinoma in her LEFT breast. She is scheduled for lumpectomy and sentinel node Bx on 07/31/2019.   Honor Loh, MSN, APRN, FNP-C, CEN [facility]  Peri-operative Services Nurse Practitioner Phone: [phone] 07/27/19 2:13 PM

## 2019-07-27 NOTE — H&P (View-Only) (Signed)
Patient ID: Cynthia Nelson, female   DOB: 06-Nov-1955, 64 y.o.   MRN: [id number]  Chief Complaint:  Left breast mass  History of Present Illness Interval note: After extensive work-up and chasing down additional other abnormalities and screening for extent of disease, we have confirmed there is no additional cancer to pursue at this time.  Previously: Cynthia Nelson is a 64 y.o. female with a palpable left breast mass of 2 mos. she had not noted any change over the last 2 months.  She has no history of nipple discharge, breast skin changes or breast pain.  She reports she does breast self-examinations at times.  She identified the mass herself.  She has a history of birth control, hysterectomy in 2005.  Denies any family history of breast cancer.  She has never been pregnant.  She had a Covid vaccination in her left arm which might be responsible for a slightly thickened cortex of some level 2 lymph nodes which were biopsied but came back negative.  The breast lesion is 7 cm from the nipple and its largest dimension is 1.5 cm.  Core biopsy revealed a 9 mm of tissue consistent with an invasive lobular carcinoma with associated atypical lobular hyperplasia.  She is ER/PR positive and HER-2 is pending on fish examination.  Past Medical History Past Medical History:  Diagnosis Date  . Family history of kidney cancer   . Family history of melanoma   . Family history of ovarian cancer   . Family history of prostate cancer   . Hyperlipidemia   . Hypertension 07/25/2019   Pt states hx white coat. BP only elevated at MD office.  . Psoriasis       Past Surgical History:  Procedure Laterality Date  . ABDOMINAL HYSTERECTOMY     partial- still has ovaries  . BREAST BIOPSY Left 06/01/2019   [place] bx mass at 1:00, venus marker, path pending  . BREAST BIOPSY Left 06/01/2019   [place] bx of LN, coil marker, path pending  . BREAST CYST ASPIRATION Right   . TONSILLECTOMY AND ADENOIDECTOMY      Allergies   Allergen Reactions  . Codeine Nausea And Vomiting    Current Outpatient Medications  Medication Sig Dispense Refill  . acetaminophen (TYLENOL) 500 MG tablet Take 500 mg by mouth daily as needed for moderate pain or headache.     Marland Kitchen atorvastatin (LIPITOR) 20 MG tablet Take 1 tablet (20 mg total) by mouth daily. 90 tablet 1  . Chlorpheniramine-DM (CORICIDIN HBP COUGH/COLD PO) Take 1 tablet by mouth daily as needed (congestion).     Marland Kitchen diclofenac sodium (VOLTAREN) 1 % GEL Apply 2 g topically 4 (four) times daily. (Patient taking differently: Apply 2 g topically 4 (four) times daily as needed. ) 100 g 1  . OTEZLA 30 MG TABS TAKE 1 TABLET BY MOUTH TWICE DAILY (Patient taking differently: Take 1 tablet by mouth in the morning and at bedtime. ) 180 tablet 0   No current facility-administered medications for this visit.    Family History Family History  Problem Relation Age of Onset  . Lung cancer Mother 46       smoker  . Melanoma Mother 79  . Ovarian cancer Sister 102  . Emphysema Father   . COPD Father   . Prostate cancer Father   . Kidney cancer Brother 91  . Cancer Maternal Grandmother        possibly due to [place]  . Cancer Maternal Grandfather  possibly due to [place]   . Heart disease Paternal Grandmother   . Hypertension Paternal Grandmother   . Heart disease Brother   . Heart Problems Maternal Uncle   . Breast cancer Neg Hx       Social History Social History   Tobacco Use  . Smoking status: Never Smoker  . Smokeless tobacco: Never Used  Vaping Use  . Vaping Use: Never used  Substance Use Topics  . Alcohol use: Never  . Drug use: Never        Review of Systems  Constitutional: Negative for weight loss.  HENT: Negative.   Eyes: Negative.   Respiratory: Negative for shortness of breath.   Cardiovascular: Negative for chest pain.  Gastrointestinal: Negative for abdominal pain.  Genitourinary: Negative.   Musculoskeletal: Negative for back  pain, joint pain and neck pain.  Skin: Negative.   Neurological: Negative.   Endo/Heme/Allergies: Negative.       Physical Exam Blood pressure (!) 197/114, pulse 84, temperature 97.6 F (36.4 C), temperature source Oral, resp. rate (!) 96, height 5' 1" (1.549 m), weight 143 lb (64.9 kg), head circumference 12" (30.5 cm). Last Weight  Most recent update: 07/27/2019 11:18 AM   Weight  64.9 kg (143 lb)            CONSTITUTIONAL: Well developed, and nourished, appropriately responsive and aware without distress.   EYES: Sclera non-icteric.   EARS, NOSE, MOUTH AND THROAT: Mask worn.     Hearing is intact to voice.  NECK: Trachea is midline, and there is no jugular venous distension.  LYMPH NODES:  Lymph nodes in the neck are not enlarged. RESPIRATORY:  Lungs are clear, and breath sounds are equal bilaterally. Normal respiratory effort without pathologic use of accessory muscles. CARDIOVASCULAR: Heart is regular in rate and rhythm. GI: The abdomen is  soft, nontender, and nondistended. There were no palpable masses. GU: Left breast with vague UOQ density with ecchymosis, mobile breast quadrant.   MUSCULOSKELETAL:  Symmetrical muscle tone appreciated in all four extremities.    SKIN: Skin turgor is normal. No pathologic skin lesions appreciated.  NEUROLOGIC:  Motor and sensation appear grossly normal.  Cranial nerves are grossly without defect. PSYCH:  Alert and oriented to person, place and time. Affect is appropriate for situation.  Data Reviewed I have personally reviewed what is currently available of the patient's imaging, recent labs and medical records.   Labs:  CBC Latest Ref Rng & Units 06/08/2019 05/29/2019  WBC 4.0 - 10.5 K/uL 8.3 6.1  Hemoglobin 12.0 - 15.0 g/dL 16.8(H) 16.4(H)  Hematocrit 36 - 46 % 48.2(H) 46.1  Platelets 150 - 400 K/uL 315 284   CMP Latest Ref Rng & Units 06/13/2019 06/08/2019 05/29/2019  Glucose 70 - 99 mg/dL - 99 80  BUN 8 - 23 mg/dL - 19 13  Creatinine  0.44 - 1.00 mg/dL - 0.80 0.84  Sodium 135 - 145 mmol/L - 137 139  Potassium 3.5 - 5.1 mmol/L - 3.7 4.5  Chloride 98 - 111 mmol/L - 103 99  CO2 22 - 32 mmol/L - 25 26  Calcium 8.7 - 10.3 mg/dL 11.3(H) 10.6(H) 10.7(H)  Total Protein 6.5 - 8.1 g/dL - 7.6 6.8  Total Bilirubin 0.3 - 1.2 mg/dL - 0.6 0.4  Alkaline Phos 38 - 126 U/L - 74 89  AST 15 - 41 U/L - 23 25  ALT 0 - 44 U/L - 21 18   SURGICAL PATHOLOGY  * THIS IS AN ADDENDUM   REPORT *  CASE: [id number]  PATIENT: Cynthia Nelson  Surgical Pathology Report  *Addendum *   Reason for Addendum #1: Breast Biomarker Results   Specimen Submitted:  A. Breast, left  B. Axilla, left   Clinical History: 63 year old female with 1.7 cm palpable mass in the 1  o'clock position of the left breast and abnormal cortical thickening of  left axillary lymph node. The patient had 2nd Moderna vaccine left arm  04-28-19. 1. IMC 2. Metastatic vs reactive LN; a Venus shaped tissue  marker clip was deployed       DIAGNOSIS:  A. BREAST, LEFT 1:00 7 CM FN; ULTRASOUND-GUIDED BIOPSY:  - INVASIVE LOBULAR CARCINOMA, CLASSIC TYPE.  - ATYPICAL LOBULAR HYPERPLASIA.  Size of invasive carcinoma: 9 mm in this sample  Histologic grade of invasive carcinoma: Grade 2            Glandular/tubular differentiation score: 3            Nuclear pleomorphism score: 2            Mitotic rate score:1            Total score: 6  Ductal carcinoma in situ: Not identified  Lymphovascular invasion: Not identified   B. LYMPH NODE, LEFT AXILLA; ULTRASOUND-GUIDED BIOPSY:  - LYMPH NODE, NEGATIVE FOR METASTATIC CARCINOMA.  - DEEPER SECTIONS WERE EXAMINED.   Comment:  The definitive grade will be assigned on the excisional specimen.  ER/PR/HER2 immunohistochemistry will be performed on block A1, with  reflex to FISH for HER2 2+. The results will be reported in an addendum.   GROSS DESCRIPTION:  A. Labeled: Left breast 1:00 7  cm from nipple  Received: Formalin  Time/date in fixative: Collected and placed into formalin at 3:24 PM on  06/01/2019.  Cold ischemic time: Less than 1 minute  Total fixation time: 6 hours  Core pieces: 3  Size: Ranging from 0.5-1.3 cm in length and 0.2 cm in diameter  Description: Received are needle core biopsy fragments of tan-yellow,  fibrofatty tissue  Ink color: Blue  Entirely submitted in cassette 1.   B. Labeled: Left axilla lymph node  Received: Formalin  Time/date in fixative: Collected and placed into formalin at 3:33 PM on  06/01/2019.  Cold ischemic time: Less than 1 minute  Total fixation time: 6 hours  Core pieces: 3  Size: Ranging from 0.7-1.2 cm in length and 0.2 cm in diameter  Description: Received are needle core biopsy fragments of tan-yellow  fibrofatty tissue  Ink color: Black  Entirely submitted in 1 cassette.      Final Diagnosis performed by Tara Rubinas, MD.  Electronically signed  06/05/2019 1:12:50PM  The electronic signature indicates that the named Attending Pathologist  has evaluated the specimen  Technical component performed at [facility], [address], [place],  [place] [address] Lab: [phone] Dir: Sanjai Nagendra, MD, MMM  Professional component performed at [facility], [facility], [address], [place], [place] [address] Lab: [phone]  Dir: Tara C. Rubinas, MD   ADDENDUM:  BREAST BIOMARKER TESTS  Estrogen Receptor (ER) Status: POSITIVE            Percentage of cells with nuclear positivity:> 90%            Average intensity of staining: Strong   Progesterone Receptor (PgR) Status: POSITIVE            Percentage of cells with nuclear positivity: 1-10%              Average intensity of staining: Moderate   HER2 (by immunohistochemistry): EQUIVOCAL (Score 2+)            Percentage of cells with uniform intense complete  membrane staining: 10%  HER2 FISH  will be performed and reported in an addendum.   Cold Ischemia and Fixation Times: Meet requirements specified in latest  version of the ASCO/CAP guidelines  Testing Performed on Block Number(s): A1    Imaging: Radiology review:   CLINICAL DATA:  64 year old presenting with a palpable lump in the [place] of the LEFT breast which she initially noted approximately 2 months ago without interval change. Annual evaluation, RIGHT breast.  The patient recently found new employment and therefore has medical insurance which she did not have at the time she found a lump approximately 2 months ago. She also states that she had both doses of the Moderna COVID-19 vaccine in the LEFT arm, the second dose on 04/28/2019.  EXAM: DIGITAL DIAGNOSTIC BILATERAL MAMMOGRAM WITH CAD AND TOMO  ULTRASOUND LEFT BREAST  COMPARISON:  Previous exam(s).  ACR Breast Density Category b: There are scattered areas of fibroglandular density.  FINDINGS: Tomosynthesis and synthesized full field CC and MLO views of both breasts were obtained. Tomosynthesis and synthesized spot-compression CC and MLO views of the area of concern in the LEFT breast were obtained.  Corresponding to the palpable concern in the UPPER OUTER QUADRANT of the LEFT breast is a hyperdense mass associated with architectural distortion. There are associated faint calcifications in this location. The distortion persists on the spot compression images. No suspicious findings elsewhere in the LEFT breast.  No findings suspicious for malignancy in the RIGHT breast.  Mammographic images were processed with CAD.  On correlative physical exam, there is palpable thickening in the UPPER OUTER QUADRANT of the LEFT breast corresponding to what the patient is feeling.  Targeted LEFT breast ultrasound is performed, showing an irregular hypoechoic mass with vague and angular margins at the 1 o'clock position approximately  7 cm from the nipple at MIDDLE to POSTERIOR depth, measuring approximately 1.5 x 1.0 x 0.9 cm, demonstrating internal power Doppler flow and demonstrating acoustic shadowing, associated with architectural distortion, accounting for the palpable concern.  Sonographic evaluation of the LEFT axilla demonstrates a solitary deep level 2 abnormal lymph node demonstrating cortical thickening up to approximately 9 mm and power Doppler flow within the thickened cortex.  IMPRESSION: 1. Highly suspicious 1.5 cm mass involving the UPPER OUTER QUADRANT of the LEFT breast, associated with architectural distortion, accounting for the palpable concern. 2. Solitary abnormal LEFT axillary lymph node demonstrating cortical thickening up to 9 mm. (As the patient had the second dose of her COVID-19 vaccine in the LEFT arm 4 weeks ago, this may represent a reactive lymph node).  RECOMMENDATION: Ultrasound-guided core needle biopsy of the LEFT breast mass and the solitary abnormal LEFT axillary lymph node.  The ultrasound core needle biopsy procedures were discussed with the patient and her questions were answered. She wishes to proceed and she will be contacted by the Breast Navigator at the [facility] in order to schedule the biopsy.  I have discussed the findings and recommendations with the patient.  BI-RADS CATEGORY  5: Highly suggestive of malignancy.   Electronically Signed   By: Evangeline Dakin M.D.   On: 05/29/2019 11:24   CLINICAL DATA:  63 year old female with recently diagnosed invasive lobular left breast cancer.  LABS:  None  EXAM: BILATERAL BREAST MRI WITH AND WITHOUT CONTRAST  TECHNIQUE: Multiplanar, multisequence MR images of both breasts were obtained prior to and following the intravenous administration of 6 ml of Gadavist  Three-dimensional MR images were rendered by post-processing of the original MR data on an independent workstation.  The three-dimensional MR images were interpreted, and findings are reported in the following complete MRI report for this study. Three dimensional images were evaluated at the independent DynaCad workstation  COMPARISON:  Previous exam(s).  FINDINGS: Breast composition: b. Scattered fibroglandular tissue.  Background parenchymal enhancement: Mild  Right breast: No mass or abnormal enhancement.  Left breast: In the upper outer left breast mid/posterior depth there is an irregular spiculated mass measuring 1.3 x 1.0 x 1.4 cm (series 10, image 79) which corresponds to the patient's biopsy proven malignancy. There is associated susceptibility artifact consistent with a biopsy marking clip. There are no additional areas of abnormal enhancement in the left breast.  There are multiple bilateral small foci with benign kinetics, which given similar morphology, multiplicity, and bilaterally, are considered benign.  Lymph nodes: No abnormal appearing lymph nodes.  Ancillary findings:  None.  IMPRESSION: Biopsy proven malignancy in the upper outer left breast measuring 1.3 x 1.0 x 1.4 cm.  RECOMMENDATION: Treatment plan for known left breast malignancy  BI-RADS CATEGORY  BI-RADS CATEGORY 6: Known biopsy-proven malignancy.   Electronically Signed   By: Nancy  Ballantyne M.D.   On: 06/13/2019 16:19   Assessment    Left breast invasive lobular carcinoma, ER/PR +  1.5 cm on U/S.  Patient Active Problem List   Diagnosis Date Noted  . Genetic testing 06/21/2019  . Goals of care, counseling/discussion 06/20/2019  . Family history of ovarian cancer   . Family history of kidney cancer   . Family history of melanoma   . Family history of prostate cancer   . Hypercalcemia 06/09/2019  . Family history of cancer 06/09/2019  . Lobular carcinoma of left breast (HCC) 06/09/2019  . Invasive carcinoma of breast (HCC) 06/09/2019  . Malignant neoplasm of upper-outer quadrant  of left breast in female, estrogen receptor positive (HCC) 06/08/2019  . Essential hypertension 05/29/2019  . Psoriasis   . Juvenile rheumatoid arthritis (HCC) 09/02/2018  . History of duodenal ulcer 09/02/2018  . 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 The risk of recurrence is similar between mastectomy and lumpectomy with radiation.  I also discussed that given the small size of the cancer would recommend localization lumpectomy with radiation to follow.  I also discussed that we would need to do a sentinel lymph node biopsy to check the nodes.  Explained to the patient that after her surgical treatment additional treatment will depend on her prognostic indicators and stage.    I discussed risks of bleeding, infection, damage to surrounding tissues, having positive margins, needing further resection, damage to nerves causing arm numbness or difficulty raising arm, causing lymphedema in the arm; as well as anesthesia risks of MI, stroke, prolonged ventilation, pulmonary embolism, thrombosis and even death.   Patient was given the opportunity to ask questions and have them answered.  They would like to proceed with left breast RFID localized lumpectomy with sentinel lymph node biopsy.       Face-to-face time spent with the patient and accompanying care providers(if present) was 35 minutes, with more than 50% of the time spent counseling, educating, and coordinating care of the patient.      Denny Rodenberg M.D., FACS 07/27/2019, 11:32   AM     

## 2019-07-27 NOTE — Patient Instructions (Addendum)
We have spoken today about removing a lump in your breast. This will be done on 07/31/19 by Dr. Christian Mate at [facility].  You will most likely be able to leave the hospital several hours after your surgery. Rarely, a patient needs to stay over night but this is a possibility.  Plan to tenatively be off work for 1-2 weeks following the surgery and may return with approximately 4 more weeks of a lifting restriction, no greater than 15 lbs.    Lumpectomy A lumpectomy is a form of "breast conserving" or "breast preservation" surgery. It may also be referred to as a partial mastectomy. During a lumpectomy, the portion of the breast that contains the cancerous tumor or breast mass (the lump) is removed. Some normal tissue around the lump may also be removed to make sure all of the tumor has been removed.  LET [facility] CARE PROVIDER KNOW ABOUT:  Any allergies you have.  All medicines you are taking, including vitamins, herbs, eye drops, creams, and over-the-counter medicines.  Previous problems you or members of your family have had with the use of anesthetics.  Any blood disorders you have.  Previous surgeries you have had.  Medical conditions you have. RISKS AND COMPLICATIONS Generally, this is a safe procedure. However, problems can occur and include:  Bleeding.  Infection.  Pain.  Temporary swelling.  Change in the shape of the breast, particularly if a large portion is removed. BEFORE THE PROCEDURE  Ask your health care provider about changing or stopping your regular medicines. This is especially important if you are taking diabetes medicines or blood thinners.  Do not eat or drink anything after midnight on the night before the procedure or as directed by your health care provider. Ask your health care provider if you can take a sip of water with any approved medicines.  On the day of surgery, your health care provider will use a mammogram or ultrasound to locate and mark  the tumor in your breast. These markings on your breast will show where the cut (incision) will be made. PROCEDURE   An IV tube will be put into one of your veins.  You may be given medicine to help you relax before the surgery (sedative). You will be given one of the following:  A medicine that numbs the area (local anesthetic).  A medicine that makes you fall asleep (general anesthetic).  Your health care provider will use a kind of electric scalpel that uses heat to minimize bleeding (electrocautery knife).  A curved incision (like a smile or frown) that follows the natural curve of your breast is made, to allow for minimal scarring and better healing.  The tumor will be removed with some of the surrounding tissue. This will be sent to the lab for analysis. Your health care provider may also remove your lymph nodes at this time if needed.  Sometimes, but not always, a rubber tube called a drain will be surgically inserted into your breast area or armpit to collect excess fluid that may accumulate in the space where the tumor was. This drain is connected to a plastic bulb on the outside of your body. This drain creates suction to help remove the fluid.  The incisions will be closed with stitches (sutures).  A bandage may be placed over the incisions. AFTER THE PROCEDURE  You will be taken to the recovery area.  You will be given medicine for pain.  A small rubber drain may be placed  in the breast for 2-3 days to prevent a collection of blood (hematoma) from developing in the breast. You will be given instructions on caring for the drain before you go home.  A pressure bandage (dressing) will be applied for 1-2 days to prevent bleeding. Ask your health care provider how to care for your bandage at home.   This information is not intended to replace advice given to you by your health care provider. Make sure you discuss any questions you have with your health care provider.     Document Released: 02/16/2006 Document Revised: 01/26/2014 Document Reviewed: 06/10/2012 Elsevier Interactive Patient Education [employer].

## 2019-07-31 ENCOUNTER — Other Ambulatory Visit: Payer: Self-pay

## 2019-07-31 ENCOUNTER — Ambulatory Visit: Payer: No Typology Code available for payment source | Admitting: Anesthesiology

## 2019-07-31 ENCOUNTER — Ambulatory Visit
Admission: RE | Admit: 2019-07-31 | Discharge: 2019-07-31 | Disposition: A | Discharge status: Home / Self Care | Payer: No Typology Code available for payment source | Source: Ambulatory Visit | Attending: Surgery | Admitting: Surgery

## 2019-07-31 ENCOUNTER — Encounter: Payer: Self-pay | Admitting: Surgery

## 2019-07-31 ENCOUNTER — Encounter
Admission: RE | Disposition: A | Discharge status: Home / Self Care | Payer: Self-pay | Source: Home / Self Care | Attending: Surgery

## 2019-07-31 ENCOUNTER — Ambulatory Visit
Admission: RE | Admit: 2019-07-31 | Discharge: 2019-07-31 | Disposition: A | Discharge status: Home / Self Care | Payer: No Typology Code available for payment source | Source: Home / Self Care | Admitting: Surgery

## 2019-07-31 ENCOUNTER — Ambulatory Visit
Admission: RE | Admit: 2019-07-31 | Discharge: 2019-07-31 | Disposition: A | Discharge status: Home / Self Care | Payer: No Typology Code available for payment source | Attending: Surgery | Admitting: Surgery

## 2019-07-31 DIAGNOSIS — Z8042 Family history of malignant neoplasm of prostate: Secondary | ICD-10-CM | POA: Insufficient documentation

## 2019-07-31 DIAGNOSIS — Z8041 Family history of malignant neoplasm of ovary: Secondary | ICD-10-CM | POA: Insufficient documentation

## 2019-07-31 DIAGNOSIS — C50919 Malignant neoplasm of unspecified site of unspecified female breast: Secondary | ICD-10-CM

## 2019-07-31 DIAGNOSIS — Z8249 Family history of ischemic heart disease and other diseases of the circulatory system: Secondary | ICD-10-CM | POA: Insufficient documentation

## 2019-07-31 DIAGNOSIS — M1990 Unspecified osteoarthritis, unspecified site: Secondary | ICD-10-CM | POA: Insufficient documentation

## 2019-07-31 DIAGNOSIS — Z8051 Family history of malignant neoplasm of kidney: Secondary | ICD-10-CM | POA: Diagnosis not present

## 2019-07-31 DIAGNOSIS — Z885 Allergy status to narcotic agent status: Secondary | ICD-10-CM | POA: Diagnosis not present

## 2019-07-31 DIAGNOSIS — Z170 Estrogen receptor positive status [ER+]: Secondary | ICD-10-CM | POA: Diagnosis not present

## 2019-07-31 DIAGNOSIS — E785 Hyperlipidemia, unspecified: Secondary | ICD-10-CM | POA: Insufficient documentation

## 2019-07-31 DIAGNOSIS — Z90710 Acquired absence of both cervix and uterus: Secondary | ICD-10-CM | POA: Diagnosis not present

## 2019-07-31 DIAGNOSIS — I10 Essential (primary) hypertension: Secondary | ICD-10-CM | POA: Insufficient documentation

## 2019-07-31 DIAGNOSIS — C50412 Malignant neoplasm of upper-outer quadrant of left female breast: Secondary | ICD-10-CM

## 2019-07-31 DIAGNOSIS — L409 Psoriasis, unspecified: Secondary | ICD-10-CM | POA: Diagnosis not present

## 2019-07-31 DIAGNOSIS — Z808 Family history of malignant neoplasm of other organs or systems: Secondary | ICD-10-CM | POA: Diagnosis not present

## 2019-07-31 DIAGNOSIS — Z801 Family history of malignant neoplasm of trachea, bronchus and lung: Secondary | ICD-10-CM | POA: Insufficient documentation

## 2019-07-31 DIAGNOSIS — Z79899 Other long term (current) drug therapy: Secondary | ICD-10-CM | POA: Diagnosis not present

## 2019-07-31 DIAGNOSIS — C801 Malignant (primary) neoplasm, unspecified: Secondary | ICD-10-CM

## 2019-07-31 DIAGNOSIS — Z791 Long term (current) use of non-steroidal anti-inflammatories (NSAID): Secondary | ICD-10-CM | POA: Insufficient documentation

## 2019-07-31 DIAGNOSIS — Z825 Family history of asthma and other chronic lower respiratory diseases: Secondary | ICD-10-CM | POA: Insufficient documentation

## 2019-07-31 HISTORY — DX: Malignant (primary) neoplasm, unspecified: C80.1

## 2019-07-31 HISTORY — PX: BREAST LUMPECTOMY,RADIO FREQ LOCALIZER,AXILLARY SENTINEL LYMPH NODE BIOPSY: SHX6900

## 2019-07-31 SURGERY — BREAST LUMPECTOMY,RADIO FREQ LOCALIZER,AXILLARY SENTINEL LYMPH NODE BIOPSY
Anesthesia: General | Laterality: Left

## 2019-07-31 MED ORDER — BUPIVACAINE LIPOSOME 1.3 % IJ SUSP: 20.0000 mL | Freq: Once | INTRAMUSCULAR | Status: DC

## 2019-07-31 MED ORDER — DEXMEDETOMIDINE HCL IN NACL 80 MCG/20ML IV SOLN
INTRAVENOUS | Status: AC
  Filled 2019-07-31: qty 20

## 2019-07-31 MED ORDER — CEFAZOLIN SODIUM-DEXTROSE 2-4 GM/100ML-% IV SOLN
2.0000 g | INTRAVENOUS | Status: AC
  Administered 2019-07-31: 2 g via INTRAVENOUS

## 2019-07-31 MED ORDER — FENTANYL CITRATE (PF) 100 MCG/2ML IJ SOLN
INTRAMUSCULAR | Status: DC | PRN
  Administered 2019-07-31 (×4): 25 ug via INTRAVENOUS

## 2019-07-31 MED ORDER — FENTANYL CITRATE (PF) 100 MCG/2ML IJ SOLN: 25.0000 ug | INTRAMUSCULAR | Status: DC | PRN

## 2019-07-31 MED ORDER — ORAL CARE MOUTH RINSE: 15.0000 mL | Freq: Once | OROMUCOSAL | Status: AC

## 2019-07-31 MED ORDER — LACTATED RINGERS IV SOLN: INTRAVENOUS | Status: DC

## 2019-07-31 MED ORDER — PROPOFOL 10 MG/ML IV BOLUS
INTRAVENOUS | Status: AC
  Filled 2019-07-31: qty 20

## 2019-07-31 MED ORDER — PROPOFOL 10 MG/ML IV BOLUS
INTRAVENOUS | Status: DC | PRN
  Administered 2019-07-31: 30 mg via INTRAVENOUS
  Administered 2019-07-31: 120 mg via INTRAVENOUS

## 2019-07-31 MED ORDER — SODIUM CHLORIDE FLUSH 0.9 % IV SOLN
INTRAVENOUS | Status: AC
  Filled 2019-07-31: qty 10

## 2019-07-31 MED ORDER — SUCCINYLCHOLINE CHLORIDE 20 MG/ML IJ SOLN
INTRAMUSCULAR | Status: DC | PRN
  Administered 2019-07-31: 100 mg via INTRAVENOUS

## 2019-07-31 MED ORDER — MIDAZOLAM HCL 2 MG/2ML IJ SOLN
INTRAMUSCULAR | Status: DC | PRN
  Administered 2019-07-31: 1 mg via INTRAVENOUS

## 2019-07-31 MED ORDER — FENTANYL CITRATE (PF) 100 MCG/2ML IJ SOLN
INTRAMUSCULAR | Status: AC
  Filled 2019-07-31: qty 2

## 2019-07-31 MED ORDER — SEVOFLURANE IN SOLN
RESPIRATORY_TRACT | Status: AC
  Filled 2019-07-31: qty 250

## 2019-07-31 MED ORDER — LIDOCAINE HCL (CARDIAC) PF 100 MG/5ML IV SOSY
PREFILLED_SYRINGE | INTRAVENOUS | Status: DC | PRN
  Administered 2019-07-31: 80 mg via INTRAVENOUS

## 2019-07-31 MED ORDER — IBUPROFEN 800 MG PO TABS: 800.0000 mg | ORAL_TABLET | Freq: Three times a day (TID) | ORAL | 0 refills | Status: DC | PRN

## 2019-07-31 MED ORDER — MIDAZOLAM HCL 2 MG/2ML IJ SOLN
INTRAMUSCULAR | Status: AC
  Filled 2019-07-31: qty 2

## 2019-07-31 MED ORDER — FAMOTIDINE 20 MG PO TABS
20.0000 mg | ORAL_TABLET | Freq: Once | ORAL | Status: AC
  Administered 2019-07-31: 20 mg via ORAL

## 2019-07-31 MED ORDER — PHENYLEPHRINE HCL (PRESSORS) 10 MG/ML IV SOLN
INTRAVENOUS | Status: AC
  Filled 2019-07-31: qty 1

## 2019-07-31 MED ORDER — ISOSULFAN BLUE 1 % ~~LOC~~ SOLN
SUBCUTANEOUS | Status: DC | PRN
  Administered 2019-07-31: 4 mL via SUBCUTANEOUS

## 2019-07-31 MED ORDER — PROMETHAZINE HCL 25 MG/ML IJ SOLN
INTRAMUSCULAR | Status: AC
  Administered 2019-07-31: 6.25 mg via INTRAVENOUS
  Filled 2019-07-31: qty 1

## 2019-07-31 MED ORDER — DEXAMETHASONE SODIUM PHOSPHATE 10 MG/ML IJ SOLN
INTRAMUSCULAR | Status: AC
  Filled 2019-07-31: qty 1

## 2019-07-31 MED ORDER — CELECOXIB 200 MG PO CAPS
200.0000 mg | ORAL_CAPSULE | ORAL | Status: AC
  Administered 2019-07-31: 200 mg via ORAL

## 2019-07-31 MED ORDER — ONDANSETRON HCL 4 MG/2ML IJ SOLN
INTRAMUSCULAR | Status: AC
  Filled 2019-07-31: qty 2

## 2019-07-31 MED ORDER — CHLORHEXIDINE GLUCONATE CLOTH 2 % EX PADS: 6.0000 | MEDICATED_PAD | Freq: Once | CUTANEOUS | Status: DC

## 2019-07-31 MED ORDER — BUPIVACAINE-EPINEPHRINE (PF) 0.25% -1:200000 IJ SOLN
INTRAMUSCULAR | Status: AC
  Filled 2019-07-31: qty 30

## 2019-07-31 MED ORDER — LIDOCAINE HCL (PF) 2 % IJ SOLN
INTRAMUSCULAR | Status: AC
  Filled 2019-07-31: qty 5

## 2019-07-31 MED ORDER — GABAPENTIN 300 MG PO CAPS
300.0000 mg | ORAL_CAPSULE | ORAL | Status: AC
  Administered 2019-07-31: 300 mg via ORAL

## 2019-07-31 MED ORDER — CHLORHEXIDINE GLUCONATE 0.12 % MT SOLN
15.0000 mL | Freq: Once | OROMUCOSAL | Status: AC
  Administered 2019-07-31: 15 mL via OROMUCOSAL

## 2019-07-31 MED ORDER — DEXMEDETOMIDINE HCL IN NACL 400 MCG/100ML IV SOLN
INTRAVENOUS | Status: DC | PRN
Start: 2019-07-31 — End: 2019-07-31
  Administered 2019-07-31: 8 ug via INTRAVENOUS

## 2019-07-31 MED ORDER — BUPIVACAINE-EPINEPHRINE (PF) 0.25% -1:200000 IJ SOLN
INTRAMUSCULAR | Status: DC | PRN
  Administered 2019-07-31: 20 mL

## 2019-07-31 MED ORDER — PROMETHAZINE HCL 25 MG/ML IJ SOLN: 6.2500 mg | Freq: Once | INTRAMUSCULAR | Status: AC | PRN

## 2019-07-31 MED ORDER — HYDROCODONE-ACETAMINOPHEN 5-325 MG PO TABS
1.0000 | ORAL_TABLET | Freq: Four times a day (QID) | ORAL | 0 refills | Status: DC | PRN
Start: 2019-07-31 — End: 2019-08-08

## 2019-07-31 MED ORDER — DEXAMETHASONE SODIUM PHOSPHATE 10 MG/ML IJ SOLN
INTRAMUSCULAR | Status: DC | PRN
  Administered 2019-07-31: 10 mg via INTRAVENOUS

## 2019-07-31 MED ORDER — TECHNETIUM TC 99M SULFUR COLLOID FILTERED
1.0000 | Freq: Once | INTRAVENOUS | Status: AC | PRN
  Administered 2019-07-31: 0.871 via INTRADERMAL

## 2019-07-31 MED ORDER — ACETAMINOPHEN 500 MG PO TABS
1000.0000 mg | ORAL_TABLET | ORAL | Status: AC
  Administered 2019-07-31: 1000 mg via ORAL

## 2019-07-31 SURGICAL SUPPLY — 40 items
APPLIER CLIP 9.375 SM OPEN (CLIP)
BINDER BREAST LRG (GAUZE/BANDAGES/DRESSINGS) ×2 IMPLANT
BLADE SURG 15 STRL LF DISP TIS (BLADE) ×1 IMPLANT
BLADE SURG 15 STRL SS (BLADE) ×1
CANISTER SUCT 1200ML W/VALVE (MISCELLANEOUS) ×2 IMPLANT
CHLORAPREP W/TINT 26 (MISCELLANEOUS) ×2 IMPLANT
CLIP APPLIE 9.375 SM OPEN (CLIP) IMPLANT
CNTNR SPEC 2.5X3XGRAD LEK (MISCELLANEOUS)
CONT SPEC 4OZ STER OR WHT (MISCELLANEOUS)
CONTAINER SPEC 2.5X3XGRAD LEK (MISCELLANEOUS) IMPLANT
COVER WAND RF STERILE (DRAPES) ×2 IMPLANT
DECANTER SPIKE VIAL GLASS SM (MISCELLANEOUS) ×2 IMPLANT
DERMABOND ADVANCED (GAUZE/BANDAGES/DRESSINGS) ×1
DERMABOND ADVANCED .7 DNX12 (GAUZE/BANDAGES/DRESSINGS) ×1 IMPLANT
DEVICE DUBIN SPECIMEN MAMMOGRA (MISCELLANEOUS) ×2 IMPLANT
DRAPE LAPAROTOMY TRNSV 106X77 (MISCELLANEOUS) ×2 IMPLANT
ELECT CAUTERY BLADE TIP 2.5 (TIP) ×2
ELECT REM PT RETURN 9FT ADLT (ELECTROSURGICAL) ×2
ELECTRODE CAUTERY BLDE TIP 2.5 (TIP) ×1 IMPLANT
ELECTRODE REM PT RTRN 9FT ADLT (ELECTROSURGICAL) ×1 IMPLANT
GAUZE 4X4 16PLY RFD (DISPOSABLE) ×2 IMPLANT
GLOVE ORTHO TXT STRL SZ7.5 (GLOVE) ×2 IMPLANT
GOWN STRL REUS W/ TWL LRG LVL3 (GOWN DISPOSABLE) ×2 IMPLANT
GOWN STRL REUS W/TWL LRG LVL3 (GOWN DISPOSABLE) ×2
KIT MARKER MARGIN INK (KITS) ×2 IMPLANT
KIT TURNOVER KIT A (KITS) ×2 IMPLANT
NEEDLE HYPO 22GX1.5 SAFETY (NEEDLE) ×2 IMPLANT
PACK BASIN MINOR (MISCELLANEOUS) ×2 IMPLANT
SET LOCALIZER 20 PROBE US (MISCELLANEOUS) ×2 IMPLANT
SET WALTER ACTIVATION W/DRAPE (SET/KITS/TRAYS/PACK) IMPLANT
SLEVE PROBE SENORX GAMMA FIND (MISCELLANEOUS) ×2 IMPLANT
SUT MNCRL 4-0 (SUTURE) ×1
SUT MNCRL 4-0 27XMFL (SUTURE) ×1
SUT VIC AB 3-0 SH 27 (SUTURE) ×1
SUT VIC AB 3-0 SH 27X BRD (SUTURE) ×1 IMPLANT
SUTURE MNCRL 4-0 27XMF (SUTURE) ×1 IMPLANT
SYR 10ML LL (SYRINGE) ×2 IMPLANT
SYR 20ML LL LF (SYRINGE) ×2 IMPLANT
TAPE TRANSPORE STRL 2 31045 (GAUZE/BANDAGES/DRESSINGS) ×2 IMPLANT
WATER STERILE IRR 1000ML POUR (IV SOLUTION) ×2 IMPLANT

## 2019-07-31 NOTE — Anesthesia Preprocedure Evaluation (Addendum)
Anesthesia Evaluation  Patient identified by MRN, date of birth, ID band Patient awake    Reviewed: Allergy & Precautions, H&P , NPO status , Patient's Chart, lab work & pertinent test results  History of Anesthesia Complications Negative for: history of anesthetic complications  Airway Mallampati: III  TM Distance: <3 FB Neck ROM: full    Dental  (+) Chipped, Poor Dentition, Loose, Implants   Pulmonary neg pulmonary ROS, neg shortness of breath,    Pulmonary exam normal        Cardiovascular Exercise Tolerance: Good hypertension, (-) angina(-) Past MI and (-) DOE Normal cardiovascular exam     Neuro/Psych negative neurological ROS  negative psych ROS   GI/Hepatic negative GI ROS, Neg liver ROS, neg GERD  ,  Endo/Other  negative endocrine ROS  Renal/GU      Musculoskeletal  (+) Arthritis ,   Abdominal   Peds  Hematology negative hematology ROS (+)   Anesthesia Other Findings Past Medical History: No date: Family history of kidney cancer No date: Family history of melanoma No date: Family history of ovarian cancer No date: Family history of prostate cancer No date: Hyperlipidemia 07/25/2019: Hypertension     Comment:  Pt states hx white coat. BP only elevated at MD office. No date: Psoriasis  Past Surgical History: No date: ABDOMINAL HYSTERECTOMY     Comment:  partial- still has ovaries 06/01/2019: BREAST BIOPSY; Left     Comment:  [place] bx mass at 1:00, venus marker, path pending 06/01/2019: BREAST BIOPSY; Left     Comment:  [place] bx of LN, coil marker, path pending No date: BREAST CYST ASPIRATION; Right No date: TONSILLECTOMY AND ADENOIDECTOMY  BMI    Body Mass Index: 27.59 kg/m      Reproductive/Obstetrics negative OB ROS                            Anesthesia Physical Anesthesia Plan  ASA: III  Anesthesia Plan: General ETT   Post-op Pain Management:    Induction:  Intravenous  PONV Risk Score and Plan: Ondansetron, Dexamethasone, Midazolam and Treatment may vary due to age or medical condition  Airway Management Planned: Oral ETT and Video Laryngoscope Planned  Additional Equipment:   Intra-op Plan:   Post-operative Plan: Extubation in OR  Informed Consent: I have reviewed the patients History and Physical, chart, labs and discussed the procedure including the risks, benefits and alternatives for the proposed anesthesia with the patient or authorized representative who has indicated his/her understanding and acceptance.     Dental Advisory Given  Plan Discussed with: Anesthesiologist, CRNA and Surgeon  Anesthesia Plan Comments: (Patient reports that she has a dental implant on the top right that is somewhat loose.  Plan to intubate with McGrath instead of LMA because of this.  Patient consented for risks of anesthesia including but not limited to:  - adverse reactions to medications - damage to eyes, teeth, lips or other oral mucosa - nerve damage due to positioning  - sore throat or hoarseness - Damage to heart, brain, nerves, lungs, other parts of body or loss of life  Patient voiced understanding.)       Anesthesia Quick Evaluation

## 2019-07-31 NOTE — Interval H&P Note (Signed)
History and Physical Interval Note:  07/31/2019 9:05 AM  Cynthia Nelson  has presented today for surgery, with the diagnosis of Left breast cancer.  The various methods of treatment have been discussed with the patient and family. After consideration of risks, benefits and other options for treatment, the patient has consented to  Procedure(s): Miller (Left) as a surgical intervention.  The patient's history has been reviewed, patient examined, no change in status, stable for surgery.  I have reviewed the patient's chart and labs.  Questions were answered to the patient's satisfaction.   The left side is marked correct, and also the side of the lymphoscintigraphy injections.   Ronny Bacon Ronny Bacon, M.D., [facility]  07/31/2019 ; 9:06 AM

## 2019-07-31 NOTE — Anesthesia Postprocedure Evaluation (Signed)
Anesthesia Post Note  Patient: Cynthia Nelson  Procedure(s) Performed: BREAST LUMPECTOMY,RADIO FREQ LOCALIZER,AXILLARY SENTINEL LYMPH NODE BIOPSY (Left )  Patient location during evaluation: PACU Anesthesia Type: General Level of consciousness: awake and alert and oriented Pain management: pain level controlled Vital Signs Assessment: post-procedure vital signs reviewed and stable Respiratory status: spontaneous breathing, nonlabored ventilation and respiratory function stable Cardiovascular status: blood pressure returned to baseline and stable Postop Assessment: no signs of nausea or vomiting Anesthetic complications: no   No complications documented.   Last Vitals:  Vitals:   07/31/19 1145 07/31/19 1200  BP: (!) 156/92 (!) 158/104  Pulse: 78 66  Resp: 18 16  Temp:    SpO2: 95% 96%    Last Pain:  Vitals:   07/31/19 1200  TempSrc:   PainSc: 0-No pain                 Amyre Segundo

## 2019-07-31 NOTE — Discharge Instructions (Signed)

## 2019-07-31 NOTE — Op Note (Signed)
  Pre-operative Diagnosis: Breast Cancer, Left    Post-operative Diagnosis: Same  Surgeon: Ronny Bacon, M.D., FACS  Anesthesia: General.   Procedure: Left lumpectomy, RFID tag directed, sentinel node biopsy  Procedure Details  The patient was seen again in the Holding Room. The benefits, complications, treatment options, and expected outcomes were discussed with the patient. The risks of bleeding, infection, recurrence of symptoms, failure to resolve symptoms, hematoma, seroma, open wound, cosmetic deformity, and the need for further surgery were discussed.  The patient was taken to Operating Room, identified as Cynthia Nelson and the procedure verified.  A Time Out was held and the above information confirmed.  Prior to the induction of general anesthesia, antibiotic prophylaxis was administered. VTE prophylaxis was in place. The patient was positioned in the supine position. Appropriate anesthesia was then administered and tolerated well. The LOCALizer is used to mark the skin for incision.  A visual dye Isosulfan Blue 4 ml was injected periareolar dermis early under aseptic conditions.  Massage was administered to this area for 5 minutes prior to securely taping it.  The chest was prepped with Chloraprep and draped in the sterile fashion.   Attention was turned to the RFID tag localization site where an incision was made in the upper outer quadrant very near the axilla. Dissection using the LOCALizer to perform a lumpectomy with adequate margins was performed. This was done with electrocautery and sharp dissection with Mayo scissors. There was minimal bleeding, and the cavity packed.  The localizer device identified a 10 mm margin from the RF ID on the medial aspect, I felt this was too close and proceeded with additional excision of the medial margin.  The specimen was taken to the back table and painted to demarcate the 6 surfaces of potential margin.  I only painted yellow for the most medial  margin on the extra section of tissue/shave margin along the medial aspect. I returned to the cavity to remove the packing, and hemostasis was confirmed with electrocautery.     Then using the hand-held probe an area of high counts was identified in the axilla, under the direction by the probe in aiding dissection of 2 lymph nodes which was sent for permanent section.  Lymph node #1 had a count of 2500, lymph node #2 was over 700, background counts were less than 10% of the highest in vivo count.  There are no other blue dyed lymph nodes, and no other palpable lymph nodes of concern.  Once assuring that hemostasis was adequate and checked multiple times the wound was closed with interrupted 3-0 Vicryl followed by 4-0 subcuticular Monocryl sutures.  Dermabond is utilized to seal the incision.  Local anesthesia of quarter percent Marcaine with epinephrine was utilized to infiltrate the soft tissues and dermis, a Depo was placed in the lumpectomy cavity after closure.   Findings: Faxitron imaging: Was utilized to confirm the central location of the RF ID marker within the lumpectomy specimen.  Also confirming the apparent proximity of distortion on the medial aspect where additional margin was taken.  Estimated Blood Loss: Minimal         Drains: None         Specimens: Right upper outer quadrant lumpectomy, additional medial margin, 2 sentinel lymph nodes.       Complications: None         Condition: Stable   Ronny Bacon, M.D., [facility]  07/31/2019 ; 11:05 AM

## 2019-07-31 NOTE — Transfer of Care (Signed)
Immediate Anesthesia Transfer of Care Note  Patient: Cynthia Nelson  Procedure(s) Performed: BREAST LUMPECTOMY,RADIO FREQ LOCALIZER,AXILLARY SENTINEL LYMPH NODE BIOPSY (Left )  Patient Location: PACU  Anesthesia Type:General  Level of Consciousness: drowsy  Airway & Oxygen Therapy: Patient Spontanous Breathing and Patient connected to face mask oxygen  Post-op Assessment: stable  Post vital signs: Reviewed and stable  Last Vitals:  Vitals Value Taken Time  BP 156/98 07/31/19 1114  Temp 36.1 C 07/31/19 1114  Pulse 99 07/31/19 1115  Resp 17 07/31/19 1115  SpO2 99 % 07/31/19 1115  Vitals shown include unvalidated device data.  Last Pain:  Vitals:   07/31/19 0815  TempSrc: Oral  PainSc: 0-No pain         Complications: No complications documented.

## 2019-07-31 NOTE — Anesthesia Procedure Notes (Signed)
Procedure Name: Intubation Date/Time: 07/31/2019 9:18 AM Performed by: Johnna Acosta, CRNA Pre-anesthesia Checklist: Patient identified, Emergency Drugs available, Suction available, Patient being monitored and Timeout performed Patient Re-evaluated:Patient Re-evaluated prior to induction Oxygen Delivery Method: Circle system utilized Preoxygenation: Pre-oxygenation with 100% oxygen Induction Type: IV induction Ventilation: Mask ventilation without difficulty Laryngoscope Size: McGraph and 3 Grade View: Grade I Tube type: Oral Tube size: 7.0 mm Number of attempts: 1 Airway Equipment and Method: Stylet and Video-laryngoscopy Placement Confirmation: ETT inserted through vocal cords under direct vision,  positive ETCO2 and breath sounds checked- equal and bilateral Secured at: 20 cm Tube secured with: Tape Dental Injury: Teeth and Oropharynx as per pre-operative assessment

## 2019-08-01 ENCOUNTER — Encounter: Payer: Self-pay | Admitting: Surgery

## 2019-08-02 ENCOUNTER — Other Ambulatory Visit: Payer: Self-pay | Admitting: Anatomic Pathology & Clinical Pathology

## 2019-08-02 LAB — SURGICAL PATHOLOGY

## 2019-08-04 ENCOUNTER — Telehealth: Payer: Self-pay | Admitting: *Deleted

## 2019-08-04 ENCOUNTER — Telehealth: Payer: Self-pay

## 2019-08-04 NOTE — Telephone Encounter (Signed)
Done.. Pt is aware of her scheduled appt.

## 2019-08-04 NOTE — Telephone Encounter (Signed)
Oncotype order submitted.  Please schedule MD f/u in 2 weeks and inform patient of appt details

## 2019-08-04 NOTE — Telephone Encounter (Signed)
Patient had surgery on 07/31/19.  Dr. Tasia Catchings would like to send specimen for Oncotype and schedule her f/u 2 weeks after order submitted.   It is unclear in her chart what insurance she has in effective.  Called and left message to call for insurance verification.

## 2019-08-04 NOTE — Telephone Encounter (Signed)
Patient returned call, I did not know where to send call to so I forwarded it to [facility]. Please call patient back

## 2019-08-07 ENCOUNTER — Telehealth: Payer: Self-pay | Admitting: Family Medicine

## 2019-08-07 ENCOUNTER — Encounter: Payer: Self-pay | Admitting: Oncology

## 2019-08-07 ENCOUNTER — Telehealth: Payer: Self-pay

## 2019-08-07 NOTE — Telephone Encounter (Signed)
Patient called to request some information from R. Orene Desanctis, who use to be her PCP when she had some surgery on 7/12.  She stated that her insurance told her that she needs to get a PA from the PCP at that time in order to have her insurance take care of the coverage.  Patient would like a call from the nurse to further explain her situation.  CB# [phone]

## 2019-08-07 NOTE — Telephone Encounter (Signed)
Elmo Putt from [place] left message that patient's appeal for [name] has been approved, JS

## 2019-08-08 ENCOUNTER — Ambulatory Visit (INDEPENDENT_AMBULATORY_CARE_PROVIDER_SITE_OTHER): Payer: Self-pay | Admitting: Surgery

## 2019-08-08 ENCOUNTER — Encounter: Payer: Self-pay | Admitting: Surgery

## 2019-08-08 ENCOUNTER — Other Ambulatory Visit: Payer: Self-pay

## 2019-08-08 VITALS — BP 158/98 | HR 93 | Temp 98.1°F | Resp 12 | Ht 61.0 in | Wt 143.8 lb

## 2019-08-08 DIAGNOSIS — C50412 Malignant neoplasm of upper-outer quadrant of left female breast: Secondary | ICD-10-CM

## 2019-08-08 DIAGNOSIS — Z170 Estrogen receptor positive status [ER+]: Secondary | ICD-10-CM

## 2019-08-08 NOTE — Patient Instructions (Addendum)
You may stop wearing the binder and start using sports bras.   Our office will contact you around December 2021 to schedule your mammogram and office visit for January 2022. Call the office if you have any questions or concerns.  Lumpectomy, Care After This sheet gives you information about how to care for yourself after your procedure. Your health care provider may also give you more specific instructions. If you have problems or questions, contact your health care provider. What can I expect after the procedure? After the procedure, it is common to have:  Breast swelling.  Breast tenderness.  Stiffness in your arm or shoulder.  A change in the shape and feel of your breast.  Scar tissue that feels hard to the touch in the area where the lump was removed. Follow these instructions at home: Medicines  Take over-the-counter and prescription medicines only as told by your health care provider.  If you were prescribed an antibiotic medicine, take it as told by your health care provider. Do not stop taking the antibiotic even if you start to feel better.  Ask your health care provider if the medicine prescribed to you: ? Requires you to avoid driving or using heavy machinery. ? Can cause constipation. You may need to take these actions to prevent or treat constipation:  Drink enough fluid to keep your urine pale yellow.  Take over-the-counter or prescription medicines.  Eat foods that are high in fiber, such as beans, whole grains, and fresh fruits and vegetables.  Limit foods that are high in fat and processed sugars, such as fried or sweet foods. Incision care      Follow instructions from your health care provider about how to take care of your incision. Make sure you: ? Wash your hands with soap and water before and after you change your bandage (dressing). If soap and water are not available, use hand sanitizer. ? Change your dressing as told by your health care  provider. ? Leave stitches (sutures), skin glue, or adhesive strips in place. These skin closures may need to stay in place for 2 weeks or longer. If adhesive strip edges start to loosen and curl up, you may trim the loose edges. Do not remove adhesive strips completely unless your health care provider tells you to do that.  Check your incision area every day for signs of infection. Check for: ? More redness, swelling, or pain. ? Fluid or blood. ? Warmth. ? Pus or a bad smell.  Keep your dressing clean and dry.  If you were sent home with a surgical drain in place, follow instructions from your health care provider about emptying it. Bathing  Do not take baths, swim, or use a hot tub until your health care provider approves.  Ask your health care provider if you may take showers. You may only be allowed to take sponge baths. Activity  Rest as told by your health care provider.  Avoid sitting for a long time without moving. Get up to take short walks every 1-2 hours. This is important to improve blood flow and breathing. Ask for help if you feel weak or unsteady.  Return to your normal activities as told by your health care provider. Ask your health care provider what activities are safe for you.  Be careful to avoid any activities that could cause an injury to your arm on the side of your surgery.  Do not lift anything that is heavier than 10 lb (4.5 kg), or the  limit that you are told, until your health care provider says that it is safe. Avoid lifting with the arm that is on the side of your surgery.  Do not carry heavy objects on your shoulder on the side of your surgery.  Do exercises to keep your shoulder and arm from getting stiff and swollen. Talk with your health care provider about which exercises are safe for you. General instructions  Wear a supportive bra as told by your health care provider.  Raise (elevate) your arm above the level of your heart while you are sitting  or lying down.  Do not wear tight jewelry on your arm, wrist, or fingers on the side of your surgery.  Keep all follow-up visits as told by your health care provider. This is important. ? You may need to be screened for extra fluid around the lymph nodes and swelling in the breast and arm (lymphedema). Follow instructions from your health care provider about how often you should be checked.  If you had any lymph nodes removed during your procedure, be sure to tell all of your health care providers. This is important information to share before you are involved in certain procedures, such as having blood tests or having your blood pressure taken. Contact a health care provider if:  You develop a rash.  You have a fever.  Your pain medicine is not working.  You have swelling, weakness, or numbness in your arm that does not improve after a few weeks.  You have new swelling in your breast.  You have any of these signs of infection: ? More redness, swelling, or pain in your incision area. ? Fluid or blood coming from your incision. ? Warmth coming from the incision area. ? Pus or a bad smell coming from your incision. Get help right away if you have:  Very bad pain in your breast or arm.  Swelling in your legs or arms.  Redness, warmth, or pain in your leg or arm.  Chest pain.  Difficulty breathing. Summary  After the procedure, it is common to have breast tenderness, swelling in your breast, and stiffness in your arm and shoulder.  Follow instructions from your health care provider about how to take care of your incision.  Do not lift anything that is heavier than 10 lb (4.5 kg), or the limit that you are told, until your health care provider says that it is safe. Avoid lifting with the arm that is on the side of your surgery.  If you had any lymph nodes removed during your procedure, be sure to tell all of your health care providers. This is important information to share  before you are involved in certain procedures, such as having blood tests or having your blood pressure taken. This information is not intended to replace advice given to you by your health care provider. Make sure you discuss any questions you have with your health care provider. Document Revised: 07/11/2018 Document Reviewed: 07/11/2018 Elsevier Patient Education  [place].

## 2019-08-08 NOTE — Progress Notes (Signed)
[facility] POST-OP OFFICE VISIT  08/08/2019  HPI: Cynthia Nelson is a 64 y.o. female 8 days s/p left upper outer quadrant lumpectomy with sentinel lymph node biopsy. She denies any fevers chills, drainage or redness of her wound.  She has had gradual development of ecchymosis along the lateral left breast and some swelling making it slightly larger than her right breast.  She has been wearing the binder persistently, and would like to transition to and a sports bra.  She has the expected soreness/tenderness and good range of motion. Reviewed her pathology report to be negative involving her sentinel lymph node and the margins of her excision.  Vital signs: BP (!) 158/98    Pulse 93    Temp 98.1 F (36.7 C)    Resp 12    Ht 5\' 1"  (1.549 m)    Wt 143 lb 12.8 oz (65.2 kg)    SpO2 97%    BMI 27.17 kg/m    Physical Exam: Constitutional: She appears well Left breast: Dependent purple discoloration consistent with ecchymosis yellow areas more cephalad toward the incision.  Some edema consistent with recent surgery in this axillary region.  Suspected/expected seroma. Skin: Incision is clean, dry and intact.  Assessment/Plan: This is a 64 y.o. female 8 days s/p left upper quadrant/axillary lumpectomy with sentinel lymph node biopsy.  Patient Active Problem List   Diagnosis Date Noted   Genetic testing 06/21/2019   Goals of care, counseling/discussion 06/20/2019   Family history of ovarian cancer    Family history of kidney cancer    Family history of melanoma    Family history of prostate cancer    Hypercalcemia 06/09/2019   Family history of cancer 06/09/2019   Lobular carcinoma of left breast ([place]) 06/09/2019   Invasive carcinoma of breast ([place]) 06/09/2019   Malignant neoplasm of upper-outer quadrant of left breast in female, estrogen receptor positive ([place]) 06/08/2019   Essential hypertension 05/29/2019   Psoriasis    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 Tasia Catchings and Dr. Baruch Gouty regarding radiation treatment.  I will likely see her back with follow-up mammography of her left breast in 6 months.  Happy to see her in the interim for any reason.   Ronny Bacon M.D., FACS 08/08/2019, 12:14 PM

## 2019-08-09 NOTE — Telephone Encounter (Signed)
I haven't even seen the patient since May so I'm not sure how I am supposed to authorize anything subsequent - Alwyn Ren do you have any advice for this situation?

## 2019-08-09 NOTE — Telephone Encounter (Signed)
Spoke with pt and she stated that she was seen on 05/29/2019 and was sent for a mammogram, ultrasound and lab work and was then scheduled for a lumpectomy for 06/16/2019. She stated that at that time she has medicaid and Merrie Roof was her PCP. The pt stated that on 06/08/2019 the pt was advised by oncology to have more blood work, genetic testing and a MRI done and her procedure was cancelled. On 07/13/2019 she received her results and her procedure was rescheduled to 07/31/2019. On 07/20/2019 her medicaid changed to [employer] which is not in network so social services changed her PCP to [facility]. The pt stated that in order to have this procedure covered she would need to obtain authorization from [employer]. The reference ID for this is [id number] and the phone number for [employer] is [phone]. Please advise.

## 2019-08-15 ENCOUNTER — Encounter: Payer: Self-pay | Admitting: Oncology

## 2019-08-15 NOTE — Telephone Encounter (Signed)
Oncotype results scanned in media tab.

## 2019-08-22 ENCOUNTER — Ambulatory Visit
Admission: RE | Admit: 2019-08-22 | Discharge: 2019-08-22 | Disposition: A | Discharge status: Home / Self Care | Payer: No Typology Code available for payment source | Source: Ambulatory Visit | Attending: Oncology | Admitting: Oncology

## 2019-08-22 ENCOUNTER — Other Ambulatory Visit: Payer: Self-pay

## 2019-08-22 ENCOUNTER — Encounter: Payer: Self-pay | Admitting: Oncology

## 2019-08-22 ENCOUNTER — Inpatient Hospital Stay: Payer: No Typology Code available for payment source | Attending: Oncology | Admitting: Oncology

## 2019-08-22 ENCOUNTER — Inpatient Hospital Stay: Payer: No Typology Code available for payment source

## 2019-08-22 VITALS — BP 155/92 | HR 67 | Temp 98.2°F | Resp 18 | Wt 143.8 lb

## 2019-08-22 DIAGNOSIS — D751 Secondary polycythemia: Secondary | ICD-10-CM

## 2019-08-22 DIAGNOSIS — Z8051 Family history of malignant neoplasm of kidney: Secondary | ICD-10-CM | POA: Insufficient documentation

## 2019-08-22 DIAGNOSIS — Z8042 Family history of malignant neoplasm of prostate: Secondary | ICD-10-CM | POA: Insufficient documentation

## 2019-08-22 DIAGNOSIS — Z801 Family history of malignant neoplasm of trachea, bronchus and lung: Secondary | ICD-10-CM | POA: Diagnosis not present

## 2019-08-22 DIAGNOSIS — C50412 Malignant neoplasm of upper-outer quadrant of left female breast: Secondary | ICD-10-CM | POA: Insufficient documentation

## 2019-08-22 DIAGNOSIS — E785 Hyperlipidemia, unspecified: Secondary | ICD-10-CM | POA: Diagnosis not present

## 2019-08-22 DIAGNOSIS — Z791 Long term (current) use of non-steroidal anti-inflammatories (NSAID): Secondary | ICD-10-CM | POA: Diagnosis not present

## 2019-08-22 DIAGNOSIS — Z170 Estrogen receptor positive status [ER+]: Secondary | ICD-10-CM

## 2019-08-22 DIAGNOSIS — L409 Psoriasis, unspecified: Secondary | ICD-10-CM | POA: Diagnosis not present

## 2019-08-22 DIAGNOSIS — Z8041 Family history of malignant neoplasm of ovary: Secondary | ICD-10-CM | POA: Diagnosis not present

## 2019-08-22 DIAGNOSIS — I10 Essential (primary) hypertension: Secondary | ICD-10-CM | POA: Insufficient documentation

## 2019-08-22 LAB — CALCIUM: Calcium: 11 mg/dL — ABNORMAL HIGH (ref 8.9–10.3)

## 2019-08-22 LAB — VITAMIN D 25 HYDROXY (VIT D DEFICIENCY, FRACTURES): Vit D, 25-Hydroxy: 28.19 ng/mL — ABNORMAL LOW (ref 30–100)

## 2019-08-22 NOTE — Progress Notes (Signed)
Hematology/Oncology note [facility] [phone] [phone]   Patient Care Team: Center, [facility] as PCP - General ([place]) Theodore Demark, RN as Oncology Nurse Navigator  REFERRING PROVIDER: Center, [place] COMPLAINTS/REASON FOR VISIT:   Follow-up for breast cancer  HISTORY OF PRESENTING ILLNESS:   Cynthia Nelson is a  64 y.o.  female with PMH listed below was seen in consultation at the request of  Center, [facility]*  for evaluation of breast cancer  She self palpated left breast mass 2 months.  Denies any nipple discharge, breast skin changes or breast pain. 05/29/2019 bilateral diagnostic mammogram showed a highly suspicious 1.5 cm mass involving the upper outer quadrant of the left breast, associated with architectural distortion.  Accounting for the palpable concern. Solitary abnormal left axillary lymph node with cortical thickening up to 9 mm.  Recent COVID-19 vaccination in the left arm about 4 weeks prior to the examination.  Patient underwent biopsy of the left breast mass and left axillary lymph node. 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 Grade 2, lymph node biopsy was negative for metastatic carcinoma. ER 90% PR 1 to 10%, HER-2 equivocal by IHC.  HER-2 FISH is pending.  Menarche age of 76, Patient has a history of birth control  Use for less than a year. She is single, no children.  No previous pregnancy. Partial hysterectomy due to fibroid disease in 2005. Previous breast biopsy 2003 FNA right breast, cyst aspiration. Denies any chest radiation. She reports a family history significant for sister deceased from ovarian cancer at age of 21, brother had kidney cancer.  Mother who was a smoker deceased from lung cancer. Grandfather and grand mother had history of cancer.  Details not available. Genetic testing was done  which showed HOXB13 and POLE BUS, no pathogenic mutation.   INTERVAL HISTORY Cynthia Nelson is a 64 y.o. female who has above history reviewed by me today presents for follow up visit for management of breast cancer Problems and complaints are listed below: 07/31/2019, underwent left lumpectomy with sentinel lymph node biopsy. 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  Margins were negative for both invasive carcinoma and DCIS pT1c pN0 Today patient reports no new complaints.  No concerns of surgical wound.  Review of Systems  Constitutional: Negative for appetite change, chills, fatigue and fever.  HENT:   Negative for hearing loss and voice change.   Eyes: Negative for eye problems.  Respiratory: Negative for chest tightness and cough.   Cardiovascular: Negative for chest pain.  Gastrointestinal: Negative for abdominal distention, abdominal pain and blood in stool.  Endocrine: Negative for hot flashes.  Genitourinary: Negative for difficulty urinating and frequency.   Musculoskeletal: Negative for arthralgias.  Skin: Negative for itching and rash.  Neurological: Negative for extremity weakness.  Hematological: Negative for adenopathy.  Psychiatric/Behavioral: Negative for confusion.    MEDICAL HISTORY:  Past Medical History:  Diagnosis Date  . Family history of kidney cancer   . Family history of melanoma   . Family history of ovarian cancer   . Family history of prostate cancer   . Hyperlipidemia   . Hypertension 07/25/2019   Pt states hx white coat. BP only elevated at MD office.  . Psoriasis     SURGICAL HISTORY: Past Surgical History:  Procedure  Laterality Date  . ABDOMINAL HYSTERECTOMY     partial- still has ovaries  . BREAST BIOPSY Left 06/01/2019   [place] bx mass at 1:00, venus marker, path pending  . BREAST  BIOPSY Left 06/01/2019   [place] bx of LN, coil marker, path pending  . BREAST CYST ASPIRATION Right   . BREAST LUMPECTOMY,RADIO FREQ LOCALIZER,AXILLARY SENTINEL LYMPH NODE BIOPSY Left 07/31/2019   Procedure: BREAST LUMPECTOMY,RADIO FREQ LOCALIZER,AXILLARY SENTINEL LYMPH NODE BIOPSY;  Surgeon: Ronny Bacon, MD;  Location: [facility] ORS;  Service: General;  Laterality: Left;  . TONSILLECTOMY AND ADENOIDECTOMY      SOCIAL HISTORY: Social History   Socioeconomic History  . Marital status: Single    Spouse name: Not on file  . Number of children: Not on file  . Years of education: Not on file  . Highest education level: Not on file  Occupational History  . Occupation: [employer]   Tobacco Use  . Smoking status: Never Smoker  . Smokeless tobacco: Never Used  Vaping Use  . Vaping Use: Never used  Substance and Sexual Activity  . Alcohol use: Never  . Drug use: Never  . Sexual activity: Not on file  Other Topics Concern  . Not on file  Social History Narrative  . Not on file   Social Determinants of Health   Financial Resource Strain:   . Difficulty of Paying Living Expenses:   Food Insecurity:   . Worried About Charity fundraiser in the Last Year:   . Arboriculturist in the Last Year:   Transportation Needs:   . Film/video editor (Medical):   Marland Kitchen Lack of Transportation (Non-Medical):   Physical Activity:   . Days of Exercise per Week:   . Minutes of Exercise per Session:   Stress:   . Feeling of Stress :   Social Connections:   . Frequency of Communication with Friends and Family:   . Frequency of Social Gatherings with Friends and Family:   . Attends Religious Services:   . Active Member of Clubs or Organizations:   . Attends Archivist Meetings:   Marland Kitchen Marital Status:   Intimate Partner Violence:   . Fear of Current or Ex-Partner:   . Emotionally Abused:   Marland Kitchen Physically Abused:   . Sexually Abused:     FAMILY HISTORY: Family History  Problem  Relation Age of Onset  . Lung cancer Mother 76       smoker  . Melanoma Mother 69  . Ovarian cancer Sister 21  . Emphysema Father   . COPD Father   . Prostate cancer Father   . Kidney cancer Brother 47  . Cancer Maternal Grandmother        possibly due to [place]  . Cancer Maternal Grandfather        possibly due to [place]   . Heart disease Paternal Grandmother   . Hypertension Paternal Grandmother   . Heart disease Brother   . Heart Problems Maternal Uncle   . Breast cancer Neg Hx     ALLERGIES:  is allergic to codeine.  MEDICATIONS:  Current Outpatient Medications  Medication Sig Dispense Refill  . acetaminophen (TYLENOL) 500 MG tablet Take 500 mg by mouth daily as needed for moderate pain or headache.     Marland Kitchen atorvastatin (LIPITOR) 20 MG tablet Take 1 tablet (20 mg total) by mouth daily. 90 tablet 1  . OTEZLA 30 MG TABS TAKE 1 TABLET BY MOUTH TWICE  DAILY (Patient taking differently: Take 1 tablet by mouth in the morning and at bedtime. ) 180 tablet 0  . Chlorpheniramine-DM (CORICIDIN HBP COUGH/COLD PO) Take 1 tablet by mouth daily as needed (congestion).  (Patient not taking: Reported on 08/22/2019)    . diclofenac sodium (VOLTAREN) 1 % GEL Apply 2 g topically 4 (four) times daily. (Patient not taking: Reported on 08/22/2019) 100 g 1   No current facility-administered medications for this visit.     PHYSICAL EXAMINATION: ECOG PERFORMANCE STATUS: 0 - Asymptomatic Vitals:   08/22/19 1042  BP: (!) 155/92  Pulse: 67  Resp: 18  Temp: 98.2 F (36.8 C)   Filed Weights   08/22/19 1042  Weight: 143 lb 12.8 oz (65.2 kg)    Physical Exam Constitutional:      General: She is not in acute distress. HENT:     Head: Normocephalic and atraumatic.  Eyes:     General: No scleral icterus. Cardiovascular:     Rate and Rhythm: Normal rate and regular rhythm.     Heart sounds: Normal heart sounds.  Pulmonary:     Effort: Pulmonary effort is normal. No respiratory  distress.     Breath sounds: No wheezing.  Abdominal:     General: Bowel sounds are normal. There is no distension.     Palpations: Abdomen is soft.  Musculoskeletal:        General: No deformity. Normal range of motion.     Cervical back: Normal range of motion and neck supple.  Skin:    General: Skin is warm and dry.     Findings: No erythema or rash.  Neurological:     Mental Status: She is alert. Mental status is at baseline.     Cranial Nerves: No cranial nerve deficit.     Coordination: Coordination normal.  Psychiatric:        Mood and Affect: Mood normal.   Breast exam was performed in seated and lying down position. Patient is Status post left breastUpper outer quadrant With focal bruising at the site of biopsy.  Palpable mass. Right breast no palpable mass.  No palpable axillary lymphadenopathy.  LABORATORY DATA:  I have reviewed the data as listed Lab Results  Component Value Date   WBC 6.6 07/27/2019   HGB 16.5 (H) 07/27/2019   HCT 46.2 (H) 07/27/2019   MCV 92.4 07/27/2019   PLT 312 07/27/2019   Recent Labs    05/29/19 1136 05/29/19 1136 06/08/19 1444 06/13/19 1035 07/27/19 0905 08/22/19 1135  NA 139  --  137  --  140  --   K 4.5  --  3.7  --  3.3*  --   CL 99  --  103  --  105  --   CO2 26  --  25  --  24  --   GLUCOSE 80  --  99  --  117*  --   BUN 13  --  19  --  12  --   CREATININE 0.84  --  0.80  --  0.81  --   CALCIUM 10.7*   < > 10.6* 11.3* 11.0* 11.0*  GFRNONAA 75  --  >60  --  >60  --   GFRAA 86  --  >60  --  >60  --   PROT 6.8  --  7.6  --  7.3  --   ALBUMIN 4.4  --  4.7  --  4.3  --   AST 25  --  23  --  24  --   ALT 18  --  21  --  16  --   ALKPHOS 89  --  74  --  66  --   BILITOT 0.4  --  0.6  --  0.8  --    < > = values in this interval not displayed.   Iron/TIBC/Ferritin/ %Sat No results found for: IRON, TIBC, FERRITIN, IRONPCTSAT    RADIOGRAPHIC STUDIES: I have personally reviewed the radiological images as listed and agreed  with the findings in the report. NM SENTINEL NODE INJECTION  Result Date: 07/31/2019 CLINICAL DATA:  Left breast cancer. EXAM: NUCLEAR MEDICINE BREAST LYMPHOSCINTIGRAPHY TECHNIQUE: Intradermal injection of radiopharmaceutical was performed at the 12 o'clock, 3 o'clock, 6 o'clock, and 9 o'clock positions around the left nipple. The patient was then sent to the operating room where the sentinel node(s) were identified and removed by the surgeon. RADIOPHARMACEUTICALS:  Total of 0.871 mCi Millipore-filtered Technetium-91msulfur colloid. IMPRESSION: Uncomplicated intradermal injection of a total of 0.871 mCi Technetium-938mulfur colloid for purposes of sentinel node identification. Electronically Signed   By: AdMarkus Daft.D.   On: 07/31/2019 10:29   MM Breast Surgical Specimen  Result Date: 07/31/2019 CLINICAL DATA:  Radiofrequency localization device was placed Jun 13, 2019 in the left breast at the site of a biopsy-proven invasive lobular carcinoma. The patient had a lumpectomy today. EXAM: SPECIMEN RADIOGRAPH OF THE LEFT BREAST COMPARISON:  Previous exam(s). FINDINGS: Status post excision of the left breast. The radioactive seed and Venus biopsy marker clip are present and completely intact. IMPRESSION: Specimen radiograph of the left breast. Electronically Signed   By: SuCurlene Dolphin.D.   On: 07/31/2019 10:39      ASSESSMENT & PLAN:  1. Malignant neoplasm of upper-outer quadrant of left breast in female, estrogen receptor positive ([place]  2. Hypercalcemia   3. Erythrocytosis   Cancer Staging Malignant neoplasm of upper-outer quadrant of left breast in female, estrogen receptor positive (HCFowlerStaging form: Breast, AJCC 8th Edition - Clinical: No stage assigned - Unsigned - Pathologic: Stage IA (pT1c, pN0, cM0, G1, ER+, PR+, HER2-, Oncotype DX score: 16) - Signed by YuEarlie ServerMD on 08/22/2019   Left breast cancer, stage I pT1c pN0 invasive carcinoma, nonspecific type grade 2, ER 90%, PR 1-10%  HER-2 equivocal by IHC.  Negative by FISH Oncotype DX recurrence score is 16, no chemotherapy benefit.  Would not offer. Recommend patient to establish care with radiation oncology for adjuvant radiation. We also discussed about the role of adjuvant therapy which will start a few weeks after she finishes radiation.    Family history of ovarian cancer, kidney cancer and personal history of breast cancer, Genetic testing showed no pathogenic mutations.  She has BUS.  #Erythrocytosis, work-up showed negative JAK2 mutation with reflex to CALR, MPL mutations. I recommend patient to discuss with primary care provider and have sleeping testing done. #Hypercalcemia, normal PTH, normal PTH RP, patient is not on oral calcium supplementation, not on thiazide.  Multiple myeloma panel showed no monoclonal proteins.Etiology of hypercalcemia is unknown.  I am not sure if the hypercalcemia is related to her breast cancer or not.  Repeat calcium level today showed persistent elevated calcium.  Less likely secondary to breast cancer.  Vitamin D levels were not elevated but mildly decreased. I ordered CT scan for further evaluation but images were not approved by insurance. Check chest x-ray.  Advised patient to have age-appropriate cancer screening, colonoscopy  Orders Placed This Encounter  Procedures  . DG Chest 2 View    Standing Status:   Future    Number of Occurrences:   1    Standing Expiration Date:   08/21/2020    Order Specific Question:   Reason for Exam (SYMPTOM  OR DIAGNOSIS REQUIRED)    Answer:   erythrocytosis, hypercalcemia    Order Specific Question:   Preferred imaging location?    Answer:   [facility]    Order Specific Question:   Radiology Contrast Protocol - do NOT remove file path    Answer:   [email or web address]  . Calcium  . VITAMIN D 25 Hydroxy (Vit-D Deficiency, Fractures)    Standing Status:   Future    Number of Occurrences:   1     Standing Expiration Date:   08/21/2020  . Ambulatory referral to Radiation Oncology    Referral Priority:   Routine    Referral Type:   Consultation    Referral Reason:   Specialty Services Required    Referred to Provider:   Noreene Filbert, MD    Requested Specialty:   Radiation Oncology    Number of Visits Requested:   1    All questions were answered. The patient knows to call the clinic with any problems questions or concerns.  Return of visit: 1 to 2 weeks after  she finishes radiation.   Earlie Server, MD, PhD Hematology Oncology [facility] Pager- [phone] 08/22/2019

## 2019-08-22 NOTE — Progress Notes (Signed)
Pt here for follow up. No new concerns voiced.   

## 2019-08-29 ENCOUNTER — Telehealth: Payer: Self-pay | Admitting: Oncology

## 2019-08-29 NOTE — Telephone Encounter (Signed)
Pt called reporting that Dr. Tasia Catchings had called her and she would like a return call. After notifying the team it was concluded that no one from the team had contact her. I called and left patient a VM that Dr. Collie Siad team had not contacted her.

## 2019-09-01 ENCOUNTER — Encounter: Payer: Self-pay | Admitting: Radiation Oncology

## 2019-09-01 ENCOUNTER — Other Ambulatory Visit: Payer: Self-pay

## 2019-09-01 ENCOUNTER — Ambulatory Visit
Admission: RE | Admit: 2019-09-01 | Discharge: 2019-09-01 | Disposition: A | Discharge status: Home / Self Care | Payer: No Typology Code available for payment source | Source: Ambulatory Visit | Attending: Radiation Oncology | Admitting: Radiation Oncology

## 2019-09-01 VITALS — BP 169/91 | HR 90 | Temp 97.4°F | Wt 144.0 lb

## 2019-09-01 DIAGNOSIS — Z8042 Family history of malignant neoplasm of prostate: Secondary | ICD-10-CM | POA: Insufficient documentation

## 2019-09-01 DIAGNOSIS — Z801 Family history of malignant neoplasm of trachea, bronchus and lung: Secondary | ICD-10-CM | POA: Insufficient documentation

## 2019-09-01 DIAGNOSIS — Z85820 Personal history of malignant melanoma of skin: Secondary | ICD-10-CM | POA: Diagnosis not present

## 2019-09-01 DIAGNOSIS — C50412 Malignant neoplasm of upper-outer quadrant of left female breast: Secondary | ICD-10-CM | POA: Diagnosis not present

## 2019-09-01 DIAGNOSIS — Z170 Estrogen receptor positive status [ER+]: Secondary | ICD-10-CM

## 2019-09-01 DIAGNOSIS — Z79899 Other long term (current) drug therapy: Secondary | ICD-10-CM | POA: Insufficient documentation

## 2019-09-01 DIAGNOSIS — Z8041 Family history of malignant neoplasm of ovary: Secondary | ICD-10-CM | POA: Diagnosis not present

## 2019-09-01 DIAGNOSIS — Z85528 Personal history of other malignant neoplasm of kidney: Secondary | ICD-10-CM | POA: Diagnosis not present

## 2019-09-01 NOTE — Consult Note (Signed)
NEW PATIENT EVALUATION  Name: Cynthia Nelson  MRN: [id number]  Date:   09/01/2019     DOB: [birth year]   This 64 y.o. female patient presents to the clinic for initial evaluation of stage Ia (T1 cN0 M0) ER/PR positive HER-2/neu negative by in situ hybridization status post wide local excision and sentinel node biopsy. Mammogram and ultrasound showed a highly suspicious 1.5 center mass in the upper outer quadrant left breast associated with architectural distortion accounting for the palpable concern. There was a solitary abnormal left axillary lymph node demonstrating cortical thickening both areas were biopsied lymph node was negative. Mass showed positive invasive lobular carcinoma classic type. She underwent wide local excision and sentinel node biopsy tumor was 1.6 cm invasive carcinoma of no special type. Overall grade 1. Margins were clear at 5 mm. DCIS was present although margin was uninvolved at 4 mm. She had Oncotype DX performed showing a score of 16 with no benefit for chemotherapy. She will be on antiestrogen therapy after completion of radiation she is now referred to radiation oncology for consideration of treatment she is doing well still some tenderness in her left axilla otherwise doing fine.  REFERRING PHYSICIAN: Center, [place]:  Chief Complaint  Patient presents with  . Breast Cancer    Initial consultation    DIAGNOSIS: The encounter diagnosis was Malignant neoplasm of upper-outer quadrant of left breast in female, estrogen receptor positive ([place]).   PREVIOUS INVESTIGATIONS:  Pathology report reviewed Mammogram and ultrasound reviewed Clinical notes reviewed  HPI: Patient is a 64 year old female who presented to self discovered mass in the left breast.  PLANNED TREATMENT REGIMEN: Left hypofractionated whole breast radiation  PAST MEDICAL HISTORY:  has a past medical history of Family history of kidney cancer, Family history of melanoma,  Family history of ovarian cancer, Family history of prostate cancer, Hyperlipidemia, Hypertension (07/25/2019), and Psoriasis.    PAST SURGICAL HISTORY:  Past Surgical History:  Procedure Laterality Date  . ABDOMINAL HYSTERECTOMY     partial- still has ovaries  . BREAST BIOPSY Left 06/01/2019   [place] bx mass at 1:00, venus marker, path pending  . BREAST BIOPSY Left 06/01/2019   [place] bx of LN, coil marker, path pending  . BREAST CYST ASPIRATION Right   . BREAST LUMPECTOMY,RADIO FREQ LOCALIZER,AXILLARY SENTINEL LYMPH NODE BIOPSY Left 07/31/2019   Procedure: BREAST LUMPECTOMY,RADIO FREQ LOCALIZER,AXILLARY SENTINEL LYMPH NODE BIOPSY;  Surgeon: Ronny Bacon, MD;  Location: [facility] ORS;  Service: General;  Laterality: Left;  . TONSILLECTOMY AND ADENOIDECTOMY      FAMILY HISTORY: family history includes COPD in her father; Cancer in her maternal grandfather and maternal grandmother; Emphysema in her father; Heart Problems in her maternal uncle; Heart disease in her brother and paternal grandmother; Hypertension in her paternal grandmother; Kidney cancer (age of onset: 64) in her brother; Lung cancer (age of onset: 35) in her mother; Melanoma (age of onset: 37) in her mother; Ovarian cancer (age of onset: 71) in her sister; Prostate cancer in her father.  SOCIAL HISTORY:  reports that she has never smoked. She has never used smokeless tobacco. She reports that she does not drink alcohol and does not use drugs.  ALLERGIES: Codeine  MEDICATIONS:  Current Outpatient Medications  Medication Sig Dispense Refill  . acetaminophen (TYLENOL) 500 MG tablet Take 500 mg by mouth daily as needed for moderate pain or headache.     Marland Kitchen atorvastatin (LIPITOR) 20 MG tablet Take 1 tablet (20 mg total) by mouth daily.  90 tablet 1  . diclofenac sodium (VOLTAREN) 1 % GEL Apply 2 g topically 4 (four) times daily. 100 g 1  . OTEZLA 30 MG TABS TAKE 1 TABLET BY MOUTH TWICE DAILY (Patient taking differently: Take 1 tablet by  mouth in the morning and at bedtime. ) 180 tablet 0  . Chlorpheniramine-DM (CORICIDIN HBP COUGH/COLD PO) Take 1 tablet by mouth daily as needed (congestion).  (Patient not taking: Reported on 08/22/2019)     No current facility-administered medications for this encounter.    ECOG PERFORMANCE STATUS:  0 - Asymptomatic  REVIEW OF SYSTEMS: Patient denies any weight loss, fatigue, weakness, fever, chills or night sweats. Patient denies any loss of vision, blurred vision. Patient denies any ringing  of the ears or hearing loss. No irregular heartbeat. Patient denies heart murmur or history of fainting. Patient denies any chest pain or pain radiating to her upper extremities. Patient denies any shortness of breath, difficulty breathing at night, cough or hemoptysis. Patient denies any swelling in the lower legs. Patient denies any nausea vomiting, vomiting of blood, or coffee ground material in the vomitus. Patient denies any stomach pain. Patient states has had normal bowel movements no significant constipation or diarrhea. Patient denies any dysuria, hematuria or significant nocturia. Patient denies any problems walking, swelling in the joints or loss of balance. Patient denies any skin changes, loss of hair or loss of weight. Patient denies any excessive worrying or anxiety or significant depression. Patient denies any problems with insomnia. Patient denies excessive thirst, polyuria, polydipsia. Patient denies any swollen glands, patient denies easy bruising or easy bleeding. Patient denies any recent infections, allergies or URI. Patient "s visual fields have not changed significantly in recent time.   PHYSICAL EXAM: BP (!) 169/91 (BP Location: Right Arm)   Pulse 90   Temp (!) 97.4 F (36.3 C) (Tympanic)   Wt 144 lb (65.3 kg)   BMI 27.21 kg/m  She status post wide local excision of left breast she still has a seroma present in her left axilla nontender. No dominant mass or nodularity is noted in  either breast in 2 positions examined. No axillary or supraclavicular adenopathy is appreciated. Well-developed well-nourished patient in NAD. HEENT reveals PERLA, EOMI, discs not visualized.  Oral cavity is clear. No oral mucosal lesions are identified. Neck is clear without evidence of cervical or supraclavicular adenopathy. Lungs are clear to A&P. Cardiac examination is essentially unremarkable with regular rate and rhythm without murmur rub or thrill. Abdomen is benign with no organomegaly or masses noted. Motor sensory and DTR levels are equal and symmetric in the upper and lower extremities. Cranial nerves II through XII are grossly intact. Proprioception is intact. No peripheral adenopathy or edema is identified. No motor or sensory levels are noted. Crude visual fields are within normal range.  LABORATORY DATA: Pathology report reviewed    RADIOLOGY RESULTS: Mammograms and ultrasound reviewed compatible with above-stated findings   IMPRESSION: Stage Ia ER/PR positive HER-2 negative invasive mammary carcinoma the left breast status post wide local excision and sentinel node biopsy in 64 year old female  PLAN: At this time elect go ahead with hypofractionated radiation therapy over 3 weeks to her left breast. Would also boost her scar another 1000 cGy using electron beam. Risks and benefits of treatment including skin reaction fatigue alteration of blood counts possible inclusion of superficial lung all were described in detail to the patient. She seems to comprehend my treatment plan well. I personally set up and ordered CT  simulation for next week. Patient also will benefit from antiestrogen therapy after completion of radiation. Patient comprehends my recommendations well.  I would like to take this opportunity to thank you for allowing me to participate in the care of your patient.Noreene Filbert, MD

## 2019-09-05 ENCOUNTER — Ambulatory Visit
Admission: RE | Admit: 2019-09-05 | Discharge: 2019-09-05 | Disposition: A | Discharge status: Home / Self Care | Payer: No Typology Code available for payment source | Source: Ambulatory Visit | Attending: Radiation Oncology | Admitting: Radiation Oncology

## 2019-09-05 DIAGNOSIS — Z170 Estrogen receptor positive status [ER+]: Secondary | ICD-10-CM | POA: Diagnosis not present

## 2019-09-05 DIAGNOSIS — Z510 Encounter for antineoplastic radiation therapy: Secondary | ICD-10-CM | POA: Diagnosis not present

## 2019-09-05 DIAGNOSIS — C50412 Malignant neoplasm of upper-outer quadrant of left female breast: Secondary | ICD-10-CM | POA: Insufficient documentation

## 2019-09-06 ENCOUNTER — Other Ambulatory Visit: Payer: Self-pay | Admitting: *Deleted

## 2019-09-06 DIAGNOSIS — Z510 Encounter for antineoplastic radiation therapy: Secondary | ICD-10-CM | POA: Diagnosis not present

## 2019-09-06 DIAGNOSIS — C50412 Malignant neoplasm of upper-outer quadrant of left female breast: Secondary | ICD-10-CM

## 2019-09-06 DIAGNOSIS — Z170 Estrogen receptor positive status [ER+]: Secondary | ICD-10-CM

## 2019-09-12 ENCOUNTER — Ambulatory Visit: Admission: RE | Admit: 2019-09-12 | Payer: No Typology Code available for payment source | Source: Ambulatory Visit

## 2019-09-12 DIAGNOSIS — Z510 Encounter for antineoplastic radiation therapy: Secondary | ICD-10-CM | POA: Diagnosis not present

## 2019-09-13 ENCOUNTER — Ambulatory Visit
Admission: RE | Admit: 2019-09-13 | Discharge: 2019-09-13 | Disposition: A | Discharge status: Home / Self Care | Payer: No Typology Code available for payment source | Source: Ambulatory Visit | Attending: Radiation Oncology | Admitting: Radiation Oncology

## 2019-09-13 DIAGNOSIS — Z510 Encounter for antineoplastic radiation therapy: Secondary | ICD-10-CM | POA: Diagnosis not present

## 2019-09-14 ENCOUNTER — Ambulatory Visit
Admission: RE | Admit: 2019-09-14 | Discharge: 2019-09-14 | Disposition: A | Discharge status: Home / Self Care | Payer: No Typology Code available for payment source | Source: Ambulatory Visit | Attending: Radiation Oncology | Admitting: Radiation Oncology

## 2019-09-14 DIAGNOSIS — Z510 Encounter for antineoplastic radiation therapy: Secondary | ICD-10-CM | POA: Diagnosis not present

## 2019-09-15 ENCOUNTER — Ambulatory Visit
Admission: RE | Admit: 2019-09-15 | Discharge: 2019-09-15 | Disposition: A | Discharge status: Home / Self Care | Payer: No Typology Code available for payment source | Source: Ambulatory Visit | Attending: Radiation Oncology | Admitting: Radiation Oncology

## 2019-09-15 DIAGNOSIS — Z510 Encounter for antineoplastic radiation therapy: Secondary | ICD-10-CM | POA: Diagnosis not present

## 2019-09-18 ENCOUNTER — Ambulatory Visit
Admission: RE | Admit: 2019-09-18 | Discharge: 2019-09-18 | Disposition: A | Discharge status: Home / Self Care | Payer: No Typology Code available for payment source | Source: Ambulatory Visit | Attending: Radiation Oncology | Admitting: Radiation Oncology

## 2019-09-18 DIAGNOSIS — Z510 Encounter for antineoplastic radiation therapy: Secondary | ICD-10-CM | POA: Diagnosis not present

## 2019-09-19 ENCOUNTER — Ambulatory Visit
Admission: RE | Admit: 2019-09-19 | Discharge: 2019-09-19 | Disposition: A | Discharge status: Home / Self Care | Payer: No Typology Code available for payment source | Source: Ambulatory Visit | Attending: Radiation Oncology | Admitting: Radiation Oncology

## 2019-09-19 DIAGNOSIS — Z510 Encounter for antineoplastic radiation therapy: Secondary | ICD-10-CM | POA: Diagnosis not present

## 2019-09-20 ENCOUNTER — Ambulatory Visit
Admission: RE | Admit: 2019-09-20 | Discharge: 2019-09-20 | Disposition: A | Discharge status: Home / Self Care | Payer: No Typology Code available for payment source | Source: Ambulatory Visit | Attending: Radiation Oncology | Admitting: Radiation Oncology

## 2019-09-20 DIAGNOSIS — Z170 Estrogen receptor positive status [ER+]: Secondary | ICD-10-CM | POA: Insufficient documentation

## 2019-09-20 DIAGNOSIS — C50412 Malignant neoplasm of upper-outer quadrant of left female breast: Secondary | ICD-10-CM | POA: Insufficient documentation

## 2019-09-20 DIAGNOSIS — Z510 Encounter for antineoplastic radiation therapy: Secondary | ICD-10-CM | POA: Insufficient documentation

## 2019-09-21 ENCOUNTER — Ambulatory Visit
Admission: RE | Admit: 2019-09-21 | Discharge: 2019-09-21 | Disposition: A | Discharge status: Home / Self Care | Payer: No Typology Code available for payment source | Source: Ambulatory Visit | Attending: Radiation Oncology | Admitting: Radiation Oncology

## 2019-09-21 DIAGNOSIS — Z510 Encounter for antineoplastic radiation therapy: Secondary | ICD-10-CM | POA: Diagnosis not present

## 2019-09-22 ENCOUNTER — Ambulatory Visit
Admission: RE | Admit: 2019-09-22 | Discharge: 2019-09-22 | Disposition: A | Discharge status: Home / Self Care | Payer: No Typology Code available for payment source | Source: Ambulatory Visit | Attending: Radiation Oncology | Admitting: Radiation Oncology

## 2019-09-22 DIAGNOSIS — Z510 Encounter for antineoplastic radiation therapy: Secondary | ICD-10-CM | POA: Diagnosis not present

## 2019-09-26 ENCOUNTER — Ambulatory Visit
Admission: RE | Admit: 2019-09-26 | Discharge: 2019-09-26 | Disposition: A | Discharge status: Home / Self Care | Payer: No Typology Code available for payment source | Source: Ambulatory Visit | Attending: Radiation Oncology | Admitting: Radiation Oncology

## 2019-09-26 DIAGNOSIS — Z510 Encounter for antineoplastic radiation therapy: Secondary | ICD-10-CM | POA: Diagnosis not present

## 2019-09-27 ENCOUNTER — Ambulatory Visit
Admission: RE | Admit: 2019-09-27 | Discharge: 2019-09-27 | Disposition: A | Discharge status: Home / Self Care | Payer: No Typology Code available for payment source | Source: Ambulatory Visit | Attending: Radiation Oncology | Admitting: Radiation Oncology

## 2019-09-27 DIAGNOSIS — Z510 Encounter for antineoplastic radiation therapy: Secondary | ICD-10-CM | POA: Diagnosis not present

## 2019-09-28 ENCOUNTER — Ambulatory Visit
Admission: RE | Admit: 2019-09-28 | Discharge: 2019-09-28 | Disposition: A | Discharge status: Home / Self Care | Payer: No Typology Code available for payment source | Source: Ambulatory Visit | Attending: Radiation Oncology | Admitting: Radiation Oncology

## 2019-09-28 ENCOUNTER — Other Ambulatory Visit: Payer: Self-pay

## 2019-09-28 ENCOUNTER — Inpatient Hospital Stay: Payer: No Typology Code available for payment source | Attending: Oncology

## 2019-09-28 DIAGNOSIS — Z510 Encounter for antineoplastic radiation therapy: Secondary | ICD-10-CM | POA: Insufficient documentation

## 2019-09-28 DIAGNOSIS — Z170 Estrogen receptor positive status [ER+]: Secondary | ICD-10-CM | POA: Insufficient documentation

## 2019-09-28 DIAGNOSIS — C50412 Malignant neoplasm of upper-outer quadrant of left female breast: Secondary | ICD-10-CM | POA: Insufficient documentation

## 2019-09-28 LAB — CBC
HCT: 47 % — ABNORMAL HIGH (ref 36.0–46.0)
Hemoglobin: 16.5 g/dL — ABNORMAL HIGH (ref 12.0–15.0)
MCH: 32.9 pg (ref 26.0–34.0)
MCHC: 35.1 g/dL (ref 30.0–36.0)
MCV: 93.6 fL (ref 80.0–100.0)
Platelets: 293 10*3/uL (ref 150–400)
RBC: 5.02 MIL/uL (ref 3.87–5.11)
RDW: 12.2 % (ref 11.5–15.5)
WBC: 6.7 10*3/uL (ref 4.0–10.5)
nRBC: 0 % (ref 0.0–0.2)

## 2019-09-29 ENCOUNTER — Ambulatory Visit
Admission: RE | Admit: 2019-09-29 | Discharge: 2019-09-29 | Disposition: A | Discharge status: Home / Self Care | Payer: No Typology Code available for payment source | Source: Ambulatory Visit | Attending: Radiation Oncology | Admitting: Radiation Oncology

## 2019-09-29 DIAGNOSIS — Z510 Encounter for antineoplastic radiation therapy: Secondary | ICD-10-CM | POA: Diagnosis not present

## 2019-09-30 DIAGNOSIS — Z510 Encounter for antineoplastic radiation therapy: Secondary | ICD-10-CM | POA: Diagnosis not present

## 2019-10-02 ENCOUNTER — Ambulatory Visit
Admission: RE | Admit: 2019-10-02 | Discharge: 2019-10-02 | Disposition: A | Discharge status: Home / Self Care | Payer: No Typology Code available for payment source | Source: Ambulatory Visit | Attending: Radiation Oncology | Admitting: Radiation Oncology

## 2019-10-02 DIAGNOSIS — Z510 Encounter for antineoplastic radiation therapy: Secondary | ICD-10-CM | POA: Diagnosis not present

## 2019-10-03 ENCOUNTER — Ambulatory Visit
Admission: RE | Admit: 2019-10-03 | Discharge: 2019-10-03 | Disposition: A | Discharge status: Home / Self Care | Payer: No Typology Code available for payment source | Source: Ambulatory Visit | Attending: Radiation Oncology | Admitting: Radiation Oncology

## 2019-10-03 DIAGNOSIS — Z510 Encounter for antineoplastic radiation therapy: Secondary | ICD-10-CM | POA: Diagnosis not present

## 2019-10-04 ENCOUNTER — Telehealth: Payer: Self-pay

## 2019-10-04 ENCOUNTER — Ambulatory Visit
Admission: RE | Admit: 2019-10-04 | Discharge: 2019-10-04 | Disposition: A | Discharge status: Home / Self Care | Payer: No Typology Code available for payment source | Source: Ambulatory Visit | Attending: Radiation Oncology | Admitting: Radiation Oncology

## 2019-10-04 DIAGNOSIS — Z510 Encounter for antineoplastic radiation therapy: Secondary | ICD-10-CM | POA: Diagnosis not present

## 2019-10-04 NOTE — Telephone Encounter (Signed)
Patient has final radiation treatment on 9/23. Please schedule her for MD follow up1-2 weeks after final tx. Please notify pt of appts. Thanks

## 2019-10-04 NOTE — Telephone Encounter (Signed)
Done.. MD follow up1-2 weeks after 9/23 final tx. Pt is sched to RTC on 10/25/19 Called pt and made her aware of her sched F/U appt.

## 2019-10-05 ENCOUNTER — Ambulatory Visit
Admission: RE | Admit: 2019-10-05 | Discharge: 2019-10-05 | Disposition: A | Discharge status: Home / Self Care | Payer: No Typology Code available for payment source | Source: Ambulatory Visit | Attending: Radiation Oncology | Admitting: Radiation Oncology

## 2019-10-05 DIAGNOSIS — Z510 Encounter for antineoplastic radiation therapy: Secondary | ICD-10-CM | POA: Diagnosis not present

## 2019-10-06 ENCOUNTER — Ambulatory Visit
Admission: RE | Admit: 2019-10-06 | Discharge: 2019-10-06 | Disposition: A | Discharge status: Home / Self Care | Payer: No Typology Code available for payment source | Source: Ambulatory Visit | Attending: Radiation Oncology | Admitting: Radiation Oncology

## 2019-10-06 DIAGNOSIS — Z510 Encounter for antineoplastic radiation therapy: Secondary | ICD-10-CM | POA: Diagnosis not present

## 2019-10-09 ENCOUNTER — Ambulatory Visit
Admission: RE | Admit: 2019-10-09 | Discharge: 2019-10-09 | Disposition: A | Discharge status: Home / Self Care | Payer: No Typology Code available for payment source | Source: Ambulatory Visit | Attending: Radiation Oncology | Admitting: Radiation Oncology

## 2019-10-09 DIAGNOSIS — Z510 Encounter for antineoplastic radiation therapy: Secondary | ICD-10-CM | POA: Diagnosis not present

## 2019-10-10 ENCOUNTER — Ambulatory Visit
Admission: RE | Admit: 2019-10-10 | Discharge: 2019-10-10 | Disposition: A | Discharge status: Home / Self Care | Payer: No Typology Code available for payment source | Source: Ambulatory Visit | Attending: Radiation Oncology | Admitting: Radiation Oncology

## 2019-10-10 DIAGNOSIS — Z510 Encounter for antineoplastic radiation therapy: Secondary | ICD-10-CM | POA: Diagnosis not present

## 2019-10-11 ENCOUNTER — Ambulatory Visit
Admission: RE | Admit: 2019-10-11 | Discharge: 2019-10-11 | Disposition: A | Discharge status: Home / Self Care | Payer: No Typology Code available for payment source | Source: Ambulatory Visit | Attending: Radiation Oncology | Admitting: Radiation Oncology

## 2019-10-11 DIAGNOSIS — Z510 Encounter for antineoplastic radiation therapy: Secondary | ICD-10-CM | POA: Diagnosis not present

## 2019-10-12 ENCOUNTER — Ambulatory Visit
Admission: RE | Admit: 2019-10-12 | Discharge: 2019-10-12 | Disposition: A | Discharge status: Home / Self Care | Payer: No Typology Code available for payment source | Source: Ambulatory Visit | Attending: Radiation Oncology | Admitting: Radiation Oncology

## 2019-10-12 DIAGNOSIS — Z510 Encounter for antineoplastic radiation therapy: Secondary | ICD-10-CM | POA: Diagnosis not present

## 2019-10-22 ENCOUNTER — Telehealth: Payer: No Typology Code available for payment source | Admitting: Physician Assistant

## 2019-10-22 ENCOUNTER — Encounter: Payer: Self-pay | Admitting: Physician Assistant

## 2019-10-22 DIAGNOSIS — H00016 Hordeolum externum left eye, unspecified eyelid: Secondary | ICD-10-CM

## 2019-10-22 MED ORDER — NEOMYCIN-POLYMYXIN-HC OP SUSP: OPHTHALMIC | 0 refills | Status: DC

## 2019-10-22 NOTE — Progress Notes (Signed)
We are sorry that you are not feeling well. Here is how we plan to help!  Based on what you have shared with me it looks like you have a stye.  A stye is an inflammation of the eyelid.  It is often a red, painful lump near the edge of the eyelid that may look like a boil or a pimple.  A stye develops when an infection occurs at the base of an eyelash.   We have made appropriate suggestions for you based upon your presentation: Your symptoms may indicate an infection of the sclera.  The use of anti-inflammatory and antibiotic eye drops for a week will help resolve this condition.  I have sent in neomycin-polymyxin HC opthalmic suspension, two to three drops in the affected eye every 4 hours.  If your symptoms do not improve over the next two to three days you should be seen in your doctor's office.    You stated that you have eye pain- styes can cause eyelid swelling and thus pressure and discomfort. If your eye pain is not due to the stye formation and is not on the eyelid, and if you have eye pain in the eye itself or any pain with eye movement, please have an urgent evaluation at an urgent care or ER.    HOME CARE:   Wash your hands often!  Let the stye open on its own. Don't squeeze or open it.  Don't rub your eyes. This can irritate your eyes and let in bacteria.  If you need to touch your eyes, wash your hands first.  Don't wear eye makeup or contact lenses until the area has healed.  GET HELP RIGHT AWAY IF:   Your symptoms do not improve.  You develop blurred or loss of vision.  Your symptoms worsen (increased discharge, pain or redness).  Thank you for choosing an e-visit.  Your e-visit answers were reviewed by a board certified advanced clinical practitioner to complete your personal care plan.  Depending upon the condition, your plan could have included both over the counter or prescription medications.  Please review your pharmacy choice.  Make sure the pharmacy is open  so you can pick up prescription now.  If there is a problem, you may contact your provider through [employer] and have the prescription routed to another pharmacy.    Your safety is important to [place].  If you have drug allergies check your prescription carefully.  For the next 24 hours you can use MyChart to ask questions about today's visit, request a non-urgent call back, or ask for a work or school excuse.  You will get an email in the next two days asking about your experience.  I hope you that your e-visit has been valuable and will speed your recovery.   I spent 5-10 minutes on review and completion of this note- Lacy Duverney [facility]

## 2019-10-25 ENCOUNTER — Other Ambulatory Visit: Payer: Self-pay

## 2019-10-25 ENCOUNTER — Encounter: Payer: Self-pay | Admitting: Oncology

## 2019-10-25 ENCOUNTER — Inpatient Hospital Stay: Payer: No Typology Code available for payment source | Attending: Oncology | Admitting: Oncology

## 2019-10-25 VITALS — BP 154/92 | HR 71 | Temp 98.2°F | Resp 20 | Wt 141.0 lb

## 2019-10-25 DIAGNOSIS — Z8051 Family history of malignant neoplasm of kidney: Secondary | ICD-10-CM | POA: Diagnosis not present

## 2019-10-25 DIAGNOSIS — Z79811 Long term (current) use of aromatase inhibitors: Secondary | ICD-10-CM | POA: Diagnosis not present

## 2019-10-25 DIAGNOSIS — Z8249 Family history of ischemic heart disease and other diseases of the circulatory system: Secondary | ICD-10-CM | POA: Diagnosis not present

## 2019-10-25 DIAGNOSIS — Z8042 Family history of malignant neoplasm of prostate: Secondary | ICD-10-CM | POA: Diagnosis not present

## 2019-10-25 DIAGNOSIS — E785 Hyperlipidemia, unspecified: Secondary | ICD-10-CM | POA: Diagnosis not present

## 2019-10-25 DIAGNOSIS — Z8041 Family history of malignant neoplasm of ovary: Secondary | ICD-10-CM | POA: Diagnosis not present

## 2019-10-25 DIAGNOSIS — C50412 Malignant neoplasm of upper-outer quadrant of left female breast: Secondary | ICD-10-CM | POA: Diagnosis present

## 2019-10-25 DIAGNOSIS — Z79899 Other long term (current) drug therapy: Secondary | ICD-10-CM | POA: Diagnosis not present

## 2019-10-25 DIAGNOSIS — I10 Essential (primary) hypertension: Secondary | ICD-10-CM | POA: Diagnosis not present

## 2019-10-25 DIAGNOSIS — D751 Secondary polycythemia: Secondary | ICD-10-CM

## 2019-10-25 DIAGNOSIS — Z801 Family history of malignant neoplasm of trachea, bronchus and lung: Secondary | ICD-10-CM | POA: Diagnosis not present

## 2019-10-25 DIAGNOSIS — Z170 Estrogen receptor positive status [ER+]: Secondary | ICD-10-CM | POA: Diagnosis not present

## 2019-10-25 MED ORDER — ANASTROZOLE 1 MG PO TABS: 1.0000 mg | ORAL_TABLET | Freq: Every day | ORAL | 1 refills | Status: DC

## 2019-10-25 MED ORDER — ANASTROZOLE 1 MG PO TABS
1.0000 mg | ORAL_TABLET | Freq: Every day | ORAL | 1 refills | Status: DC
Start: 2019-10-25 — End: 2019-10-25

## 2019-10-25 NOTE — Addendum Note (Signed)
Addended by: Earlie Server on: 10/25/2019 10:55 AM   Modules accepted: Orders

## 2019-10-25 NOTE — Progress Notes (Signed)
Hematology/Oncology note [facility] [phone] [phone]   Patient Care Team: [facility] as PCP - General (General Practice) Shaver, Anne F, RN as Oncology Nurse Navigator  REFERRING PROVIDER: [facility]  CHIEF COMPLAINTS/REASON FOR VISIT:   Follow-up for breast cancer  HISTORY OF PRESENTING ILLNESS:   Cynthia Nelson is a  64 y.o.  female with PMH listed below was seen in consultation at the request of  [facility]  for evaluation of breast cancer  She self palpated left breast mass 2 months.  Denies any nipple discharge, breast skin changes or breast pain. 05/29/2019 bilateral diagnostic mammogram showed a highly suspicious 1.5 cm mass involving the upper outer quadrant of the left breast, associated with architectural distortion.  Accounting for the palpable concern. Solitary abnormal left axillary lymph node with cortical thickening up to 9 mm.  Recent COVID-19 vaccination in the left arm about 4 weeks prior to the examination.  Patient underwent biopsy of the left breast mass and left axillary lymph node. 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 Grade 2, lymph node biopsy was negative for metastatic carcinoma. ER 90% PR 1 to 10%, HER-2 equivocal by IHC.  HER-2 FISH is pending.  Menarche age of 13, Patient has a history of birth control  Use for less than a year. She is single, no children.  No previous pregnancy. Partial hysterectomy due to fibroid disease in 2005. Previous breast biopsy 2003 FNA right breast, cyst aspiration. Denies any chest radiation.  Family history of cancer, She reports a family history significant for sister deceased from ovarian cancer at age of 57, brother had kidney cancer.  Mother who was a smoker deceased from lung cancer. Grandfather and grand mother had history of cancer.  Details not  available. Genetic testing was done which showed HOXB13 and POLE VUS, no pathogenic mutation.  07/31/2019, underwent left lumpectomy with sentinel lymph node biopsy. 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  Margins were negative for both invasive carcinoma and DCIS pT1c pN0 September 2021, patient finished adjuvant radiation.  INTERVAL HISTORY Cynthia Nelson is a 64 y.o. female who has above history reviewed by me today presents for follow up visit for management of breast cancer Problems and complaints are listed below: She finished adjuvant radiation therapy.  Today she has no new complaints She informs me that she is starting a new job and is very stressful.  Review of Systems  Constitutional: Negative for appetite change, chills, fatigue and fever.  HENT:   Negative for hearing loss and voice change.   Eyes: Negative for eye problems.  Respiratory: Negative for chest tightness and cough.   Cardiovascular: Negative for chest pain.  Gastrointestinal: Negative for abdominal distention, abdominal pain and blood in stool.  Endocrine: Negative for hot flashes.  Genitourinary: Negative for difficulty urinating and frequency.   Musculoskeletal: Negative for arthralgias.  Skin: Negative for itching and rash.  Neurological: Negative for extremity weakness.  Hematological: Negative for adenopathy.  Psychiatric/Behavioral: Negative for confusion.    MEDICAL HISTORY:  Past Medical History:  Diagnosis Date  . Family history of kidney cancer   . Family history of melanoma   . Family history of ovarian cancer   . Family history of prostate cancer   . Hyperlipidemia   . Hypertension   07/25/2019   Pt states hx white coat. BP only elevated at MD office.  . Psoriasis     SURGICAL HISTORY: Past Surgical History:  Procedure  Laterality Date  . ABDOMINAL HYSTERECTOMY     partial- still has ovaries  . BREAST BIOPSY Left 06/01/2019   us bx mass at 1:00, venus marker, path pending  . BREAST BIOPSY Left 06/01/2019   us bx of LN, coil marker, path pending  . BREAST CYST ASPIRATION Right   . BREAST LUMPECTOMY,RADIO FREQ LOCALIZER,AXILLARY SENTINEL LYMPH NODE BIOPSY Left 07/31/2019   Procedure: BREAST LUMPECTOMY,RADIO FREQ LOCALIZER,AXILLARY SENTINEL LYMPH NODE BIOPSY;  Surgeon: Rodenberg, Denny, MD;  Location: [facility] ORS;  Service: General;  Laterality: Left;  . TONSILLECTOMY AND ADENOIDECTOMY      SOCIAL HISTORY: Social History   Socioeconomic History  . Marital status: Single    Spouse name: Not on file  . Number of children: Not on file  . Years of education: Not on file  . Highest education level: Not on file  Occupational History  . Occupation: [employer]   Tobacco Use  . Smoking status: Never Smoker  . Smokeless tobacco: Never Used  Vaping Use  . Vaping Use: Never used  Substance and Sexual Activity  . Alcohol use: Never  . Drug use: Never  . Sexual activity: Not on file  Other Topics Concern  . Not on file  Social History Narrative  . Not on file   Social Determinants of Health   Financial Resource Strain:   . Difficulty of Paying Living Expenses: Not on file  Food Insecurity:   . Worried About Running Out of Food in the Last Year: Not on file  . Ran Out of Food in the Last Year: Not on file  Transportation Needs:   . Lack of Transportation (Medical): Not on file  . Lack of Transportation (Non-Medical): Not on file  Physical Activity:   . Days of Exercise per Week: Not on file  . Minutes of Exercise per Session: Not on file  Stress:   . Feeling of Stress : Not on file  Social Connections:   . Frequency of Communication with Friends and Family: Not on file  . Frequency of Social Gatherings with Friends and Family: Not on file  . Attends Religious Services: Not on file  .  Active Member of Clubs or Organizations: Not on file  . Attends Club or Organization Meetings: Not on file  . Marital Status: Not on file  Intimate Partner Violence:   . Fear of Current or Ex-Partner: Not on file  . Emotionally Abused: Not on file  . Physically Abused: Not on file  . Sexually Abused: Not on file    FAMILY HISTORY: Family History  Problem Relation Age of Onset  . Lung cancer Mother 70       smoker  . Melanoma Mother 55  . Ovarian cancer Sister 56  . Emphysema Father   . COPD Father   . Prostate cancer Father   . Kidney cancer Brother 68  . Cancer Maternal Grandmother        possibly due to [employer]ann[employer] company  . Cancer Maternal Grandfather        possibly due to tannery   . Heart disease Paternal Grandmother   . Hypertension Paternal Grandmother   . Heart disease Brother   . Heart Problems Maternal Uncle   . Breast cancer Neg Hx     ALLERGIES:  is allergic to codeine.    MEDICATIONS:  Current Outpatient Medications  Medication Sig Dispense Refill  . acetaminophen (TYLENOL) 500 MG tablet Take 500 mg by mouth daily as needed for moderate pain or headache.     Marland Kitchen atorvastatin (LIPITOR) 20 MG tablet Take 1 tablet (20 mg total) by mouth daily. 90 tablet 1  . OTEZLA 30 MG TABS TAKE 1 TABLET BY MOUTH TWICE DAILY (Patient taking differently: Take 1 tablet by mouth in the morning and at bedtime. ) 180 tablet 0  . Chlorpheniramine-DM (CORICIDIN HBP COUGH/COLD PO) Take 1 tablet by mouth daily as needed (congestion).  (Patient not taking: Reported on 08/22/2019)    . diclofenac sodium (VOLTAREN) 1 % GEL Apply 2 g topically 4 (four) times daily. 100 g 1  . NEOMYCIN-POLYMYXIN-HC, OPHTH, SUSP Apply 2 drops to left eye every 4 hours for 5 days. (Patient not taking: Reported on 10/25/2019) 7.5 mL 0   No current facility-administered medications for this visit.     PHYSICAL EXAMINATION: ECOG PERFORMANCE STATUS: 0 - Asymptomatic Vitals:   10/25/19 0956  BP: (!) 154/92  Pulse:  71  Resp: 20  Temp: 98.2 F (36.8 C)   Filed Weights   10/25/19 0956  Weight: 141 lb (64 kg)    Physical Exam Constitutional:      General: She is not in acute distress. HENT:     Head: Normocephalic and atraumatic.  Eyes:     General: No scleral icterus. Cardiovascular:     Rate and Rhythm: Normal rate and regular rhythm.     Heart sounds: Normal heart sounds.  Pulmonary:     Effort: Pulmonary effort is normal. No respiratory distress.     Breath sounds: No wheezing.  Abdominal:     General: Bowel sounds are normal. There is no distension.     Palpations: Abdomen is soft.  Musculoskeletal:        General: No deformity. Normal range of motion.     Cervical back: Normal range of motion and neck supple.  Skin:    General: Skin is warm and dry.     Findings: No erythema or rash.  Neurological:     Mental Status: She is alert. Mental status is at baseline.     Cranial Nerves: No cranial nerve deficit.     Coordination: Coordination normal.  Psychiatric:        Mood and Affect: Mood normal.     LABORATORY DATA:  I have reviewed the data as listed Lab Results  Component Value Date   WBC 6.7 09/28/2019   HGB 16.5 (H) 09/28/2019   HCT 47.0 (H) 09/28/2019   MCV 93.6 09/28/2019   PLT 293 09/28/2019   Recent Labs    05/29/19 1136 05/29/19 1136 06/08/19 1444 06/13/19 1035 07/27/19 0905 08/22/19 1135  NA 139  --  137  --  140  --   K 4.5  --  3.7  --  3.3*  --   CL 99  --  103  --  105  --   CO2 26  --  25  --  24  --   GLUCOSE 80  --  99  --  117*  --   BUN 13  --  19  --  12  --   CREATININE 0.84  --  0.80  --  0.81  --   CALCIUM 10.7*   < > 10.6* 11.3* 11.0* 11.0*  GFRNONAA 75  --  >60  --  >60  --  GFRAA 86  --  >60  --  >60  --   PROT 6.8  --  7.6  --  7.3  --   ALBUMIN 4.4  --  4.7  --  4.3  --   AST 25  --  23  --  24  --   ALT 18  --  21  --  16  --   ALKPHOS 89  --  74  --  66  --   BILITOT 0.4  --  0.6  --  0.8  --    < > = values in this  interval not displayed.   Iron/TIBC/Ferritin/ %Sat No results found for: IRON, TIBC, FERRITIN, IRONPCTSAT    RADIOGRAPHIC STUDIES: I have personally reviewed the radiological images as listed and agreed with the findings in the report. No results found.    ASSESSMENT & PLAN:  1. Malignant neoplasm of upper-outer quadrant of left breast in female, estrogen receptor positive ([place])   Cancer Staging Malignant neoplasm of upper-outer quadrant of left breast in female, estrogen receptor positive ([place]) Staging form: Breast, AJCC 8th Edition - Clinical: No stage assigned - Unsigned - Pathologic: Stage IA (pT1c, pN0, cM0, G1, ER+, PR+, HER2-, Oncotype DX score: 16) - Signed by Earlie Server, MD on 08/22/2019   Left breast cancer, stage I pT1c pN0 invasive carcinoma, nonspecific type grade 2, ER 90%, PR 1-10% HER-2 equivocal by IHC.  Negative by FISH Oncotype DX recurrence score is 16, no chemotherapy benefit.  Would not offer. Finished adjuvant radiation. Rationale of using aromatase inhibitor -Arimidex  discussed with patient.  Side effects of Arimidex including but not limited to hot flush, joint pain, fatigue, mood swing, osteoporosis, increased risk of cardiovascular disease, discussed with patient. Patient voices understanding and willing to proceed.  Duration of therapy plan 10 years if she tolerates. Check baseline bone density Erythrocytosis, negative JAK2 mutation with reflex to CALR, MPL mutations.  Recommend patient to discuss with primary care provider for sleep study to rule out sleep apnea.  #Hypercalcemia, normal PTH, normal PTH RP, patient is not on oral calcium supplementation, not on thiazide.  Multiple myeloma panel showed no monoclonal proteins.Etiology of hypercalcemia is unknown.  I am not sure if the hypercalcemia is related to her breast cancer or not.  Repeat calcium level today showed persistent elevated calcium.  Less likely secondary to breast cancer.  Vitamin D levels were  not elevated but mildly decreased.  I ordered CT scan for further evaluation but images were not approved by insurance. Advised patient to have age-appropriate cancer screening, colonoscopy We discussed about referring her to endocrinologist. Patient reports being very stressed recently due to change of job and has to balance between treatments and her job requirements. She is emotional during the current visit.  Offered patient to talk to social worker to see if there is any other resources she may utilize.  Orders Placed This Encounter  Procedures  . DG Bone Density    Standing Status:   Future    Standing Expiration Date:   10/24/2020    Order Specific Question:   Reason for Exam (SYMPTOM  OR DIAGNOSIS REQUIRED)    Answer:   history of breast cancer, aromatase inhibitor    Order Specific Question:   Preferred imaging location?    Answer:   [facility]    All questions were answered. The patient knows to call the clinic with any problems questions or concerns.  Return of visit: 4 to  6 weeks Earlie Server, MD, PhD Hematology Oncology [facility] Pager- [phone] 10/25/2019

## 2019-10-25 NOTE — Progress Notes (Signed)
Patient has finished XRT.  Her left eye is red, swollen, itchy and was painful but the pain has improved.  This issue stared on Sunday and had an e-visit that diagnosed her with a sty and prescribed eye medication that is on back order and unavailable.

## 2019-11-13 ENCOUNTER — Encounter: Payer: Self-pay | Admitting: Radiation Oncology

## 2019-11-13 ENCOUNTER — Ambulatory Visit
Admission: RE | Admit: 2019-11-13 | Discharge: 2019-11-13 | Disposition: A | Discharge status: Home / Self Care | Payer: No Typology Code available for payment source | Source: Ambulatory Visit | Attending: Radiation Oncology | Admitting: Radiation Oncology

## 2019-11-13 ENCOUNTER — Other Ambulatory Visit: Payer: Self-pay

## 2019-11-13 VITALS — BP 157/98 | HR 77 | Temp 97.6°F | Resp 16 | Wt 142.6 lb

## 2019-11-13 DIAGNOSIS — C50412 Malignant neoplasm of upper-outer quadrant of left female breast: Secondary | ICD-10-CM

## 2019-11-13 DIAGNOSIS — Z170 Estrogen receptor positive status [ER+]: Secondary | ICD-10-CM

## 2019-11-13 NOTE — Progress Notes (Signed)
Radiation Oncology Follow up Note  Name: Cynthia Nelson   Date:   11/13/2019 MRN:  [id number] DOB: Apr 19, 1955    This 64 y.o. female presents to the clinic today for 1 month follow-up status post whole breast radiation to her left breast for stage Ia (T1c N0 M0) ER/PR positive invasive mammary carcinoma.  REFERRING PROVIDER: [facility]  HPI: Patient is a 64 year old female now at 1 month having completed whole breast radiation to her left breast for stage Ia invasive mammary carcinoma ER/PR positive seen today in routine follow-up she is doing well. She specifically denies breast tenderness cough or bone pain.. She has been started on Arimidex tolerating it well without side effect.  COMPLICATIONS OF TREATMENT: none  FOLLOW UP COMPLIANCE: keeps appointments   PHYSICAL EXAM:  BP (!) 157/98 (BP Location: Right Arm, Patient Position: Sitting, Cuff Size: Normal)   Pulse 77   Temp 97.6 F (36.4 C)   Resp 16   Wt 142 lb 9.6 oz (64.7 kg)   BMI 26.94 kg/m  Lungs are clear to A&P cardiac examination essentially unremarkable with regular rate and rhythm. No dominant mass or nodularity is noted in either breast in 2 positions examined. Incision is well-healed. No axillary or supraclavicular adenopathy is appreciated. Cosmetic result is excellent. Well-developed well-nourished patient in NAD. HEENT reveals PERLA, EOMI, discs not visualized.  Oral cavity is clear. No oral mucosal lesions are identified. Neck is clear without evidence of cervical or supraclavicular adenopathy. Lungs are clear to A&P. Cardiac examination is essentially unremarkable with regular rate and rhythm without murmur rub or thrill. Abdomen is benign with no organomegaly or masses noted. Motor sensory and DTR levels are equal and symmetric in the upper and lower extremities. Cranial nerves II through XII are grossly intact. Proprioception is intact. No peripheral adenopathy or edema is identified. No motor or sensory  levels are noted. Crude visual fields are within normal range.  RADIOLOGY RESULTS: No current films to review  PLAN: Present time patient is doing well 1 month out from whole breast radiation and pleased with her overall progress. She is continues on Arimidex without side effect. I have asked to see her out in 4 to 5 months for follow-up. Patient is to call with any concerns.  I would like to take this opportunity to thank you for allowing me to participate in the care of your patient.Noreene Filbert, MD

## 2019-11-24 ENCOUNTER — Other Ambulatory Visit: Payer: Self-pay | Admitting: Family Medicine

## 2019-11-30 ENCOUNTER — Ambulatory Visit
Admission: RE | Admit: 2019-11-30 | Discharge: 2019-11-30 | Disposition: A | Discharge status: Home / Self Care | Payer: No Typology Code available for payment source | Source: Ambulatory Visit | Attending: Oncology | Admitting: Oncology

## 2019-11-30 ENCOUNTER — Other Ambulatory Visit: Payer: Self-pay

## 2019-11-30 DIAGNOSIS — C50412 Malignant neoplasm of upper-outer quadrant of left female breast: Secondary | ICD-10-CM | POA: Diagnosis not present

## 2019-11-30 DIAGNOSIS — Z170 Estrogen receptor positive status [ER+]: Secondary | ICD-10-CM | POA: Insufficient documentation

## 2019-12-01 ENCOUNTER — Inpatient Hospital Stay: Payer: No Typology Code available for payment source | Attending: Oncology

## 2019-12-01 ENCOUNTER — Encounter: Payer: Self-pay | Admitting: Oncology

## 2019-12-01 ENCOUNTER — Inpatient Hospital Stay (HOSPITAL_BASED_OUTPATIENT_CLINIC_OR_DEPARTMENT_OTHER): Payer: No Typology Code available for payment source | Admitting: Oncology

## 2019-12-01 VITALS — BP 158/98 | HR 87 | Resp 16 | Wt 140.0 lb

## 2019-12-01 DIAGNOSIS — Z809 Family history of malignant neoplasm, unspecified: Secondary | ICD-10-CM

## 2019-12-01 DIAGNOSIS — Z170 Estrogen receptor positive status [ER+]: Secondary | ICD-10-CM | POA: Diagnosis not present

## 2019-12-01 DIAGNOSIS — R110 Nausea: Secondary | ICD-10-CM | POA: Diagnosis not present

## 2019-12-01 DIAGNOSIS — C50412 Malignant neoplasm of upper-outer quadrant of left female breast: Secondary | ICD-10-CM

## 2019-12-01 DIAGNOSIS — D751 Secondary polycythemia: Secondary | ICD-10-CM

## 2019-12-01 DIAGNOSIS — M8580 Other specified disorders of bone density and structure, unspecified site: Secondary | ICD-10-CM | POA: Diagnosis not present

## 2019-12-01 LAB — COMPREHENSIVE METABOLIC PANEL
ALT: 19 U/L (ref 0–44)
AST: 25 U/L (ref 15–41)
Albumin: 4.4 g/dL (ref 3.5–5.0)
Alkaline Phosphatase: 75 U/L (ref 38–126)
Anion gap: 8 (ref 5–15)
BUN: 12 mg/dL (ref 8–23)
CO2: 27 mmol/L (ref 22–32)
Calcium: 11.2 mg/dL — ABNORMAL HIGH (ref 8.9–10.3)
Chloride: 101 mmol/L (ref 98–111)
Creatinine, Ser: 0.68 mg/dL (ref 0.44–1.00)
GFR, Estimated: >60 mL/min (ref >60)
Glucose, Bld: 87 mg/dL (ref 70–99)
Potassium: 3.8 mmol/L (ref 3.5–5.1)
Sodium: 136 mmol/L (ref 135–145)
Total Bilirubin: 0.5 mg/dL (ref 0.3–1.2)
Total Protein: 7.4 g/dL (ref 6.5–8.1)

## 2019-12-01 LAB — CBC WITH DIFFERENTIAL/PLATELET
Abs Immature Granulocytes: 0.01 10*3/uL (ref 0.00–0.07)
Basophils Absolute: 0.1 10*3/uL (ref 0.0–0.1)
Basophils Relative: 1 %
Eosinophils Absolute: 0.5 10*3/uL (ref 0.0–0.5)
Eosinophils Relative: 8 %
HCT: 46 % (ref 36.0–46.0)
Hemoglobin: 16.1 g/dL — ABNORMAL HIGH (ref 12.0–15.0)
Immature Granulocytes: 0 %
Lymphocytes Relative: 14 %
Lymphs Abs: 0.9 10*3/uL (ref 0.7–4.0)
MCH: 32.3 pg (ref 26.0–34.0)
MCHC: 35 g/dL (ref 30.0–36.0)
MCV: 92.2 fL (ref 80.0–100.0)
Monocytes Absolute: 1 10*3/uL (ref 0.1–1.0)
Monocytes Relative: 15 %
Neutro Abs: 4.1 10*3/uL (ref 1.7–7.7)
Neutrophils Relative %: 62 %
Platelets: 297 10*3/uL (ref 150–400)
RBC: 4.99 MIL/uL (ref 3.87–5.11)
RDW: 12.3 % (ref 11.5–15.5)
WBC: 6.6 10*3/uL (ref 4.0–10.5)
nRBC: 0 % (ref 0.0–0.2)

## 2019-12-01 MED ORDER — ONDANSETRON HCL 4 MG PO TABS: 4.0000 mg | ORAL_TABLET | Freq: Three times a day (TID) | ORAL | 1 refills | Status: DC | PRN

## 2019-12-01 NOTE — Progress Notes (Signed)
Survivorship Care Plan visit completed.  Treatment summary reviewed and given to patient.  ASCO answers booklet reviewed and given to patient.  CARE program and Cancer Transitions discussed with patient along with other resources cancer center offers to patients and caregivers.  Patient verbalized understanding.  SCP packet mailed.  Patient declined visit with  APP to have a Virtual visit to introduce them to the Survivorship Clinic due to starting new job working from 9:00 to 6:00 pm and cannot have Virtual visit at this time.   Will call later to schedule if she feels the need.   Encouraged patient to call for any questions or concerns.

## 2019-12-01 NOTE — Progress Notes (Signed)
Hematology/Oncology note [facility] Telephone:([phone] [phone]   Patient Care Team: Center, [facility] as PCP - General (General Practice) Campbell Lerner, MD as Consulting Physician (General Surgery) Rickard Patience, MD as Consulting Physician (Oncology) Scarlett Presto, RN as Oncology Nurse Navigator Carmina Miller, MD as Referring Physician (Radiation Oncology)  REFERRING PROVIDER: Center, [facility]  CHIEF COMPLAINTS/REASON FOR VISIT:   Follow-up for breast cancer  HISTORY OF PRESENTING ILLNESS:   Cynthia Nelson is a  64 y.o.  female with PMH listed below was seen in consultation at the request of  Center, Cynthia Nelson*  for evaluation of breast cancer  She self palpated left breast mass 2 months.  Denies any nipple discharge, breast skin changes or breast pain. 05/29/2019 bilateral diagnostic mammogram showed a highly suspicious 1.5 cm mass involving the upper outer quadrant of the left breast, associated with architectural distortion.  Accounting for the palpable concern. Solitary abnormal left axillary lymph node with cortical thickening up to 9 mm.  Recent COVID-19 vaccination in the left arm about 4 weeks prior to the examination.  Patient underwent biopsy of the left breast mass and left axillary lymph node. 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 Grade 2, lymph node biopsy was negative for metastatic carcinoma. ER 90% PR 1 to 10%, HER-2 equivocal by IHC.  HER-2 FISH is pending.  Menarche age of 58, Patient has a history of birth control  Use for less than a year. She is single, no children.  No previous pregnancy. Partial hysterectomy due to fibroid disease in 2005. Previous breast biopsy 2003 FNA right breast, cyst aspiration. Denies any chest radiation.  Family history of cancer, She reports a family history significant for sister deceased from  ovarian cancer at age of 72, brother had kidney cancer.  Mother who was a smoker deceased from lung cancer. Grandfather and grand mother had history of cancer.  Details not available. Genetic testing was done which showed HOXB13 and POLE VUS, no pathogenic mutation.  07/31/2019, underwent left lumpectomy with sentinel lymph node biopsy. 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  Margins were negative for both invasive carcinoma and DCIS pT1c pN0 September 2021, patient finished adjuvant radiation.  INTERVAL HISTORY [name] is a 64 y.o. female who has above history reviewed by me today presents for follow up visit for management of breast cancer Problems and complaints are listed below: 10/25/2019, patient started on Arimidex 1 mg daily. Patient reports feeling nauseated after taking medication as well as hot flash, night sweats, increased body aches and pains   Review of Systems  Constitutional: Negative for appetite change, chills, fatigue and fever.  HENT:   Negative for hearing loss and voice change.   Eyes: Negative for eye problems.  Respiratory: Negative for chest tightness and cough.   Cardiovascular: Negative for chest pain.  Gastrointestinal: Positive for nausea. Negative for abdominal distention, abdominal pain and blood in stool.  Endocrine: Negative for hot flashes.  Genitourinary: Negative for difficulty urinating and frequency.   Musculoskeletal: Positive for arthralgias.  Skin: Negative for itching and rash.  Neurological: Negative for extremity weakness.  Hematological: Negative for adenopathy.  Psychiatric/Behavioral: Negative for confusion.    MEDICAL HISTORY:  Past Medical History:  Diagnosis Date  . Family history of kidney cancer   .  Family history of melanoma   . Family history of ovarian cancer    . Family history of prostate cancer   . Hyperlipidemia   . Hypertension 07/25/2019   Pt states hx white coat. BP only elevated at MD office.  . Psoriasis     SURGICAL HISTORY: Past Surgical History:  Procedure Laterality Date  . ABDOMINAL HYSTERECTOMY     partial- still has ovaries  . BREAST BIOPSY Left 06/01/2019   [place] bx mass at 1:00, venus marker, path pending  . BREAST BIOPSY Left 06/01/2019   [place] bx of LN, coil marker, path pending  . BREAST CYST ASPIRATION Right   . BREAST LUMPECTOMY,RADIO FREQ LOCALIZER,AXILLARY SENTINEL LYMPH NODE BIOPSY Left 07/31/2019   Procedure: BREAST LUMPECTOMY,RADIO FREQ LOCALIZER,AXILLARY SENTINEL LYMPH NODE BIOPSY;  Surgeon: Ronny Bacon, MD;  Location: [facility] ORS;  Service: General;  Laterality: Left;  . TONSILLECTOMY AND ADENOIDECTOMY      SOCIAL HISTORY: Social History   Socioeconomic History  . Marital status: Single    Spouse name: Not on file  . Number of children: Not on file  . Years of education: Not on file  . Highest education level: Not on file  Occupational History  . Occupation: [employer]   Tobacco Use  . Smoking status: Never Smoker  . Smokeless tobacco: Never Used  Vaping Use  . Vaping Use: Never used  Substance and Sexual Activity  . Alcohol use: Never  . Drug use: Never  . Sexual activity: Not on file  Other Topics Concern  . Not on file  Social History Narrative  . Not on file   Social Determinants of Health   Financial Resource Strain:   . Difficulty of Paying Living Expenses: Not on file  Food Insecurity:   . Worried About Charity fundraiser in the Last Year: Not on file  . Ran Out of Food in the Last Year: Not on file  Transportation Needs:   . Lack of Transportation (Medical): Not on file  . Lack of Transportation (Non-Medical): Not on file  Physical Activity:   . Days of Exercise per Week: Not on file  . Minutes of Exercise per Session: Not on file  Stress:   . Feeling of Stress : Not on  file  Social Connections:   . Frequency of Communication with Friends and Family: Not on file  . Frequency of Social Gatherings with Friends and Family: Not on file  . Attends Religious Services: Not on file  . Active Member of Clubs or Organizations: Not on file  . Attends Archivist Meetings: Not on file  . Marital Status: Not on file  Intimate Partner Violence:   . Fear of Current or Ex-Partner: Not on file  . Emotionally Abused: Not on file  . Physically Abused: Not on file  . Sexually Abused: Not on file    FAMILY HISTORY: Family History  Problem Relation Age of Onset  . Lung cancer Mother 4       smoker  . Melanoma Mother 66  . Ovarian cancer Sister 86  . Emphysema Father   . COPD Father   . Prostate cancer Father   . Kidney cancer Brother 40  . Cancer Maternal Grandmother        possibly due to [place]  . Cancer Maternal Grandfather        possibly due to [place]   . Heart disease Paternal Grandmother   . Hypertension Paternal Grandmother   .  Heart disease Brother   . Heart Problems Maternal Uncle   . Breast cancer Neg Hx     ALLERGIES:  is allergic to codeine.  MEDICATIONS:  Current Outpatient Medications  Medication Sig Dispense Refill  . acetaminophen (TYLENOL) 500 MG tablet Take 500 mg by mouth daily as needed for moderate pain or headache.     . anastrozole (ARIMIDEX) 1 MG tablet Take 1 tablet (1 mg total) by mouth daily. 90 tablet 1  . atorvastatin (LIPITOR) 20 MG tablet TAKE 1 TABLET BY MOUTH EVERY DAY 90 tablet 0  . Chlorpheniramine-DM (CORICIDIN HBP COUGH/COLD PO) Take 1 tablet by mouth daily as needed (congestion).     . OTEZLA 30 MG TABS TAKE 1 TABLET BY MOUTH TWICE DAILY (Patient taking differently: Take 1 tablet by mouth in the morning and at bedtime. ) 180 tablet 0  . diclofenac sodium (VOLTAREN) 1 % GEL Apply 2 g topically 4 (four) times daily. 100 g 1   No current facility-administered medications for this visit.      PHYSICAL EXAMINATION: ECOG PERFORMANCE STATUS: 1 - Symptomatic but completely ambulatory Vitals:   12/01/19 1059  BP: (!) 158/98  Pulse: 87  Resp: 16  SpO2: 98%   Filed Weights   12/01/19 1059  Weight: 140 lb (63.5 kg)    Physical Exam Constitutional:      General: She is not in acute distress. HENT:     Head: Normocephalic and atraumatic.  Eyes:     General: No scleral icterus. Cardiovascular:     Rate and Rhythm: Normal rate and regular rhythm.     Heart sounds: Normal heart sounds.  Pulmonary:     Effort: Pulmonary effort is normal. No respiratory distress.     Breath sounds: No wheezing.  Abdominal:     General: Bowel sounds are normal. There is no distension.     Palpations: Abdomen is soft.  Musculoskeletal:        General: No deformity. Normal range of motion.     Cervical back: Normal range of motion and neck supple.  Skin:    General: Skin is warm and dry.     Findings: No erythema or rash.  Neurological:     Mental Status: She is alert. Mental status is at baseline.     Cranial Nerves: No cranial nerve deficit.     Coordination: Coordination normal.  Psychiatric:        Mood and Affect: Mood normal.     LABORATORY DATA:  I have reviewed the data as listed Lab Results  Component Value Date   WBC 6.6 12/01/2019   HGB 16.1 (H) 12/01/2019   HCT 46.0 12/01/2019   MCV 92.2 12/01/2019   PLT 297 12/01/2019   Recent Labs    05/29/19 1136 05/29/19 1136 06/08/19 1444 06/13/19 1035 07/27/19 0905 08/22/19 1135 12/01/19 1010  NA 139  --  137  --  140  --  136  K 4.5   < > 3.7  --  3.3*  --  3.8  CL 99   < > 103  --  105  --  101  CO2 26   < > 25  --  24  --  27  GLUCOSE 80  --  99  --  117*  --  87  BUN 13  --  19  --  12  --  12  CREATININE 0.84   < > 0.80  --  0.81  --  0.68  CALCIUM 10.7*   < > 10.6*   < > 11.0* 11.0* 11.2*  GFRNONAA 75   < > >60  --  >60  --  >60  GFRAA 86  --  >60  --  >60  --   --   PROT 6.8  --  7.6  --  7.3  --   7.4  ALBUMIN 4.4  --  4.7  --  4.3  --  4.4  AST 25   < > 23  --  24  --  25  ALT 18   < > 21  --  16  --  19  ALKPHOS 89   < > 74  --  66  --  75  BILITOT 0.4  --  0.6  --  0.8  --  0.5   < > = values in this interval not displayed.   Iron/TIBC/Ferritin/ %Sat No results found for: IRON, TIBC, FERRITIN, IRONPCTSAT    RADIOGRAPHIC STUDIES: I have personally reviewed the radiological images as listed and agreed with the findings in the report. DG Bone Density  Result Date: 11/30/2019 EXAM: DUAL X-RAY ABSORPTIOMETRY (DXA) FOR BONE MINERAL DENSITY IMPRESSION: Your patient Shaunessy Dobratz completed a BMD test on 11/30/2019 using the [employer] (software version: 14.10) manufactured by [employer]. The following summarizes the results of our evaluation. Technologist: [facility] PATIENT BIOGRAPHICAL: Name: Alyrica, Thurow Patient ID: [id number] Birth Date: [birth year] Height: 61.0 in. Gender: Female Exam Date: 11/30/2019 Weight: 139.4 lbs. Indications: Caucasian, High Risk Meds, History of Breast Cancer, History of Radiation, Hysterectomy, Postmenopausal Fractures: Treatments: Letrozole, Vitamin D DENSITOMETRY RESULTS: Site      Region     Measured Date Measured Age WHO Classification Young Adult T-score BMD         %Change vs. Previous Significant Change (*) AP Spine L1-L4 11/30/2019 62.8 Normal -0.8 1.095 g/cm2 DualFemur Neck Right 11/30/2019 62.8 Osteopenia -1.9 0.771 g/cm2 ASSESSMENT: The BMD measured at Femur Neck Right is 0.771 g/cm2 with a T-score of -1.9. This patient is considered osteopenic according to [facility]) criteria. The scan quality is good. World [facility]) criteria for post-menopausal, Caucasian Women: Normal:                   T-score at or above -1 SD Osteopenia/low bone mass: T-score between -1 and -2.5 SD Osteoporosis:             T-score at or below -2.5 SD RECOMMENDATIONS: 1. All patients should optimize calcium and vitamin D intake.  2. Consider FDA-approved medical therapies in postmenopausal women and men aged 61 years and older, based on the following: a. A hip or vertebral(clinical or morphometric) fracture b. T-score < -2.5 at the femoral neck or spine after appropriate evaluation to exclude secondary causes c. Low bone mass (T-score between -1.0 and -2.5 at the femoral neck or spine) and a 10-year probability of a hip fracture > 3% or a 10-year probability of a major osteoporosis-related fracture > 20% based on the [place]-adapted WHO algorithm 3. Clinician judgment and/or patient preferences may indicate treatment for people with 10-year fracture probabilities above or below these levels FOLLOW-UP: People with diagnosed cases of osteoporosis or at high risk for fracture should have regular bone mineral density tests. For patients eligible for Medicare, routine testing is allowed once every 2 years. The testing frequency can be increased to one year for patients who have rapidly progressing disease, those who are receiving  or discontinuing medical therapy to restore bone mass, or have additional risk factors. FRAX* RESULTS:  (version: 3.5) 10-year Probability of Fracture1 Major Osteoporotic Fracture2 Hip Fracture 10.1% 1.3% Population: [place] (Caucasian) Risk Factors: None Based on Femur (Right) Neck BMD 1 -The 10-year probability of fracture may be lower than reported if the patient has received treatment. 2 -Major Osteoporotic Fracture: Clinical Spine, Forearm, Hip or Shoulder *FRAX is a Materials engineer of the [employer] for Metabolic Bone Disease, a [place] (WHO) [facility]. ASSESSMENT: The probability of a major osteoporotic fracture is 10.1% within the next ten years. The probability of a hip fracture is 1.3% within the next ten years. Electronically Signed   By: Marlaine Hind M.D.   On: 11/30/2019 10:38      ASSESSMENT & PLAN:  1. Malignant neoplasm of upper-outer quadrant of  left breast in female, estrogen receptor positive ([place])   2. Hypercalcemia   3. Erythrocytosis   4. Family history of cancer   Cancer Staging Malignant neoplasm of upper-outer quadrant of left breast in female, estrogen receptor positive ([place]) Staging form: Breast, AJCC 8th Edition - Clinical: No stage assigned - Unsigned - Pathologic: Stage IA (pT1c, pN0, cM0, G1, ER+, PR+, HER2-, Oncotype DX score: 16) - Signed by Earlie Server, MD on 08/22/2019   Left breast cancer, stage I pT1c pN0 invasive carcinoma, nonspecific type grade 2, ER 90%, PR 1-10% HER-2 equivocal by IHC.  Negative by FISH Oncotype DX recurrence score is 16, no chemotherapy benefit.  Would not offer. Finished adjuvant radiation. Labs are reviewed and with patient. Tolerates Arimidex 1 mg daily with mild to moderate difficulties . Continue Arimidex for now.  #Nausea, Zofran as needed. Prescription sent to pharmacy. #Osteopea, 11/30/2019 bone density showed osteopenia, right femur neck -1.9 10-year probability of fracture 10.1%, hip fracture 1.3%. Discussed with patient about bisphosphonate treatments. Need dental clearance.  #Hypercalcemia, normal PTH, normal PTH RP, patient is not on oral calcium supplementation, not on thiazide.  Multiple myeloma panel showed no monoclonal proteins.Etiology of hypercalcemia is unknown. Not clear if hypercalcemia is related or not related to her breast cancer. Check angiotensin converting enzyme. Check calcium/creatinine ratio. Attempt to obtain CT scanning for further evaluation however was denied by insurance. We discussed about age-appropriate cancer screening with colonoscopy which patient has scheduled. Referred to endocrinologist  #Erythrocytosis, hemoglobin 10.1. I urged patient to discuss with primary care provider to get sleep study done.   Orders Placed This Encounter  Procedures  . Ambulatory referral to Endocrinology    Referral Priority:   Routine    Referral Type:    Consultation    Referral Reason:   Specialty Services Required    Referred to Provider:   Judi Cong, MD    Number of Visits Requested:   1    All questions were answered. The patient knows to call the clinic with any problems questions or concerns.  Return of visit: 3 months Earlie Server, MD, PhD Hematology Oncology [facility] at [facility] Pager- [phone] 12/01/2019

## 2019-12-01 NOTE — Progress Notes (Signed)
Patient here for oncology follow-up appointment, expresses concerns of Night sweats, fatigue, and n/v.

## 2019-12-02 LAB — CALCIUM / CREATININE RATIO, URINE
Calcium, Ur: 20.9 mg/dL
Calcium/Creat.Ratio: 516 mg/g creat — ABNORMAL HIGH (ref 29–442)
Creatinine, Urine: 40.5 mg/dL

## 2019-12-02 LAB — ANGIOTENSIN CONVERTING ENZYME: Angiotensin-Converting Enzyme: 44 U/L (ref 14–82)

## 2019-12-07 ENCOUNTER — Ambulatory Visit (INDEPENDENT_AMBULATORY_CARE_PROVIDER_SITE_OTHER): Payer: No Typology Code available for payment source | Admitting: Dermatology

## 2019-12-07 ENCOUNTER — Encounter: Payer: Self-pay | Admitting: Dermatology

## 2019-12-07 ENCOUNTER — Other Ambulatory Visit: Payer: Self-pay

## 2019-12-07 DIAGNOSIS — L409 Psoriasis, unspecified: Secondary | ICD-10-CM

## 2019-12-07 DIAGNOSIS — L853 Xerosis cutis: Secondary | ICD-10-CM | POA: Diagnosis not present

## 2019-12-07 DIAGNOSIS — Z853 Personal history of malignant neoplasm of breast: Secondary | ICD-10-CM | POA: Diagnosis not present

## 2019-12-07 MED ORDER — OTEZLA 30 MG PO TABS: 30.0000 mg | ORAL_TABLET | Freq: Two times a day (BID) | ORAL | 6 refills | Status: DC

## 2019-12-07 NOTE — Progress Notes (Signed)
   Follow-Up Visit   Subjective  Cynthia Nelson is a 64 y.o. female who presents for the following: Psoriasis (scalp, legs, 71m f/u, Otezla 30mg  1 po bid no s/e form [place]).  The following portions of the chart were reviewed this encounter and updated as appropriate:  Tobacco  Allergies  Meds  Problems  Med Hx  Surg Hx  Fam Hx     Review of Systems:  No other skin or systemic complaints except as noted in HPI or Assessment and Plan.  Objective  Well appearing patient in no apparent distress; mood and affect are within normal limits.  A focused examination was performed including scalp, hands, legs. Relevant physical exam findings are noted in the Assessment and Plan.  Objective  Scalp, hands, lesg: Hands, legs and scalp clear today   Assessment & Plan    Recent Breast Ca with lumpectomy causing flare of Psoriasis  Xerosis - diffuse xerotic patches - recommend gentle, hydrating skin care - gentle skin care handout given -Start Cerave cream qd  Psoriasis Scalp, hands, lesg severe on oral systemic Otezla.   Flared with pruritis of scalp -likely flared from breast cancer lumpectomy stress and procedure.  Psoriasis is a chronic non-curable, but treatable genetic/hereditary disease that may have other systemic features affecting other organ systems such as joints (Psoriatic Arthritis). It is associated with an increased risk of inflammatory bowel disease, heart disease, non-alcoholic fatty liver disease, and depression.     Cont Otezla 30mg  1 po bid   Apremilast (OTEZLA) 30 MG TABS - Scalp, hands, lesg Discussed topical treatment but patient declines.  Return in about 6 months (around 06/05/2020) for Psoriasis f/u.   I, Othelia Pulling, RMA, am acting as scribe for Sarina Ser, MD .  Documentation: I have reviewed the above documentation for accuracy and completeness, and I agree with the above.  Sarina Ser, MD

## 2019-12-12 ENCOUNTER — Encounter: Payer: Self-pay | Admitting: Dermatology

## 2020-01-02 ENCOUNTER — Other Ambulatory Visit: Payer: Self-pay

## 2020-01-02 DIAGNOSIS — C50412 Malignant neoplasm of upper-outer quadrant of left female breast: Secondary | ICD-10-CM

## 2020-02-15 ENCOUNTER — Other Ambulatory Visit: Payer: Self-pay | Admitting: Family Medicine

## 2020-02-15 NOTE — Telephone Encounter (Signed)
Requested medication (s) are due for refill today:   Yes  Requested medication (s) are on the active medication list:   Yes  Future visit scheduled:   No   Last ordered: 11/24/2019 #90, 0 refills  Clinic Note:   She was last seen by Merrie Roof.   Has not been seen by another provider.  Returned since not established with another provider.   Requested Prescriptions  Pending Prescriptions Disp Refills   atorvastatin (LIPITOR) 20 MG tablet [Pharmacy Med Name: ATORVASTATIN 20 MG TABLET] 90 tablet 0    Sig: TAKE 1 TABLET BY MOUTH EVERY DAY      Cardiovascular:  Antilipid - Statins Failed - 02/15/2020  3:03 AM      Failed - Total Cholesterol in normal range and within 360 days    Cholesterol, Total  Date Value Ref Range Status  05/29/2019 274 (H) 100 - 199 mg/dL Final          Failed - LDL in normal range and within 360 days    LDL Chol Calc (NIH)  Date Value Ref Range Status  05/29/2019 176 (H) 0 - 99 mg/dL Final          Failed - Triglycerides in normal range and within 360 days    Triglycerides  Date Value Ref Range Status  05/29/2019 158 (H) 0 - 149 mg/dL Final          Passed - HDL in normal range and within 360 days    HDL  Date Value Ref Range Status  05/29/2019 69 >39 mg/dL Final          Passed - Patient is not pregnant      Passed - Valid encounter within last 12 months    Recent Outpatient Visits           8 months ago Essential hypertension   [facility] [place], [place]   1 year ago [place], Lilia Argue, [place]       Future Appointments             In 3 months Nehemiah Massed Monia Sabal, MD [place]

## 2020-02-15 NOTE — Telephone Encounter (Signed)
Not a patient of [facility] please route accordingly. 

## 2020-02-27 ENCOUNTER — Ambulatory Visit: Payer: No Typology Code available for payment source | Admitting: Oncology

## 2020-02-27 ENCOUNTER — Other Ambulatory Visit: Payer: No Typology Code available for payment source

## 2020-02-29 ENCOUNTER — Other Ambulatory Visit: Payer: Self-pay | Admitting: Family Medicine

## 2020-02-29 NOTE — Telephone Encounter (Signed)
  Notes to clinic Is this pt associated with your practice?

## 2020-03-14 NOTE — Telephone Encounter (Signed)
Not a patient of [facility] please route accordingly.

## 2020-03-18 ENCOUNTER — Other Ambulatory Visit: Payer: No Typology Code available for payment source

## 2020-03-18 ENCOUNTER — Ambulatory Visit: Payer: No Typology Code available for payment source | Admitting: Oncology

## 2020-03-21 ENCOUNTER — Other Ambulatory Visit: Payer: Self-pay

## 2020-03-21 DIAGNOSIS — Z170 Estrogen receptor positive status [ER+]: Secondary | ICD-10-CM

## 2020-03-21 DIAGNOSIS — C50412 Malignant neoplasm of upper-outer quadrant of left female breast: Secondary | ICD-10-CM

## 2020-03-22 ENCOUNTER — Inpatient Hospital Stay: Payer: Medicaid Other | Attending: Oncology

## 2020-03-22 ENCOUNTER — Encounter: Payer: Self-pay | Admitting: Oncology

## 2020-03-22 ENCOUNTER — Inpatient Hospital Stay (HOSPITAL_BASED_OUTPATIENT_CLINIC_OR_DEPARTMENT_OTHER): Payer: Medicaid Other | Admitting: Oncology

## 2020-03-22 VITALS — BP 182/100 | HR 87 | Temp 98.3°F | Resp 18 | Wt 140.0 lb

## 2020-03-22 DIAGNOSIS — Z79899 Other long term (current) drug therapy: Secondary | ICD-10-CM | POA: Diagnosis not present

## 2020-03-22 DIAGNOSIS — D751 Secondary polycythemia: Secondary | ICD-10-CM

## 2020-03-22 DIAGNOSIS — Z170 Estrogen receptor positive status [ER+]: Secondary | ICD-10-CM | POA: Diagnosis not present

## 2020-03-22 DIAGNOSIS — I10 Essential (primary) hypertension: Secondary | ICD-10-CM

## 2020-03-22 DIAGNOSIS — C50412 Malignant neoplasm of upper-outer quadrant of left female breast: Secondary | ICD-10-CM | POA: Diagnosis present

## 2020-03-22 DIAGNOSIS — Z79811 Long term (current) use of aromatase inhibitors: Secondary | ICD-10-CM | POA: Diagnosis not present

## 2020-03-22 DIAGNOSIS — E876 Hypokalemia: Secondary | ICD-10-CM | POA: Insufficient documentation

## 2020-03-22 DIAGNOSIS — M85851 Other specified disorders of bone density and structure, right thigh: Secondary | ICD-10-CM | POA: Diagnosis not present

## 2020-03-22 LAB — CBC WITH DIFFERENTIAL/PLATELET
Abs Immature Granulocytes: 0.01 10*3/uL (ref 0.00–0.07)
Basophils Absolute: 0.1 10*3/uL (ref 0.0–0.1)
Basophils Relative: 1 %
Eosinophils Absolute: 0.2 10*3/uL (ref 0.0–0.5)
Eosinophils Relative: 3 %
HCT: 44.8 % (ref 36.0–46.0)
Hemoglobin: 15.6 g/dL — ABNORMAL HIGH (ref 12.0–15.0)
Immature Granulocytes: 0 %
Lymphocytes Relative: 27 %
Lymphs Abs: 1.7 10*3/uL (ref 0.7–4.0)
MCH: 32.8 pg (ref 26.0–34.0)
MCHC: 34.8 g/dL (ref 30.0–36.0)
MCV: 94.1 fL (ref 80.0–100.0)
Monocytes Absolute: 0.7 10*3/uL (ref 0.1–1.0)
Monocytes Relative: 10 %
Neutro Abs: 3.7 10*3/uL (ref 1.7–7.7)
Neutrophils Relative %: 59 %
Platelets: 277 10*3/uL (ref 150–400)
RBC: 4.76 MIL/uL (ref 3.87–5.11)
RDW: 11.9 % (ref 11.5–15.5)
WBC: 6.4 10*3/uL (ref 4.0–10.5)
nRBC: 0 % (ref 0.0–0.2)

## 2020-03-22 LAB — COMPREHENSIVE METABOLIC PANEL
ALT: 18 U/L (ref 0–44)
AST: 21 U/L (ref 15–41)
Albumin: 4.3 g/dL (ref 3.5–5.0)
Alkaline Phosphatase: 72 U/L (ref 38–126)
Anion gap: 11 (ref 5–15)
BUN: 13 mg/dL (ref 8–23)
CO2: 23 mmol/L (ref 22–32)
Calcium: 10.7 mg/dL — ABNORMAL HIGH (ref 8.9–10.3)
Chloride: 101 mmol/L (ref 98–111)
Creatinine, Ser: 0.62 mg/dL (ref 0.44–1.00)
GFR, Estimated: >60 mL/min (ref >60)
Glucose, Bld: 98 mg/dL (ref 70–99)
Potassium: 3.3 mmol/L — ABNORMAL LOW (ref 3.5–5.1)
Sodium: 135 mmol/L (ref 135–145)
Total Bilirubin: 0.8 mg/dL (ref 0.3–1.2)
Total Protein: 7 g/dL (ref 6.5–8.1)

## 2020-03-22 NOTE — Progress Notes (Signed)
Patient here for follow up. No new concerns voiced. Pts BP elevated, she reports that she has "white coat syndrome" and denies any headache, lightheadedness or dizziness.

## 2020-03-23 NOTE — Progress Notes (Signed)
Hematology/Oncology note [facility] Telephone:([phone] [phone]   Patient Care Team: Center, [facility] as PCP - General (General Practice) Ronny Bacon, MD as Consulting Physician (General Surgery) Earlie Server, MD as Consulting Physician (Oncology) Theodore Demark, RN as Oncology Nurse Navigator Noreene Filbert, MD as Referring Physician (Radiation Oncology)  REFERRING PROVIDER: Center, [place] COMPLAINTS/REASON FOR VISIT:   Follow-up for breast cancer  HISTORY OF PRESENTING ILLNESS:   Cynthia Nelson is a  65 y.o.  female with PMH listed below was seen in consultation at the request of  Center, Jenny [facility]*  for evaluation of breast cancer  She self palpated left breast mass 2 months.  Denies any nipple discharge, breast skin changes or breast pain. 05/29/2019 bilateral diagnostic mammogram showed a highly suspicious 1.5 cm mass involving the upper outer quadrant of the left breast, associated with architectural distortion.  Accounting for the palpable concern. Solitary abnormal left axillary lymph node with cortical thickening up to 9 mm.  Recent COVID-19 vaccination in the left arm about 4 weeks prior to the examination.  Patient underwent biopsy of the left breast mass and left axillary lymph node. 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 Grade 2, lymph node biopsy was negative for metastatic carcinoma. ER 90% PR 1 to 10%, HER-2 equivocal by IHC.  HER-2 FISH is pending.  Menarche age of 26, Patient has a history of birth control  Use for less than a year. She is single, no children.  No previous pregnancy. Partial hysterectomy due to fibroid disease in 2005. Previous breast biopsy 2003 FNA right breast, cyst aspiration. Denies any chest radiation.  Family history of cancer, She reports a family history significant for sister deceased from  ovarian cancer at age of 110, brother had kidney cancer.  Mother who was a smoker deceased from lung cancer. Grandfather and grand mother had history of cancer.  Details not available. Genetic testing was done which showed HOXB13 and POLE VUS, no pathogenic mutation.  07/31/2019, underwent left lumpectomy with sentinel lymph node biopsy. 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  Margins were negative for both invasive carcinoma and DCIS pT1c pN0 September 2021, patient finished adjuvant radiation.  INTERVAL HISTORY Cynthia Nelson is a 65 y.o. female who has above history reviewed by me today presents for follow up visit for management of breast cancer Problems and complaints are listed below: 10/25/2019, patient started on Arimidex 1 mg daily.  Tolerates well.  Manageable side effects, hot flash, body aches. Blood pressure is elevated.  Patient reports having whitecoat syndrome.  Review of Systems  Constitutional: Negative for appetite change, chills, fatigue and fever.  HENT:   Negative for hearing loss and voice change.   Eyes: Negative for eye problems.  Respiratory: Negative for chest tightness and cough.   Cardiovascular: Negative for chest pain.  Gastrointestinal: Negative for abdominal distention, abdominal pain, blood in stool and nausea.  Endocrine: Negative for hot flashes.  Genitourinary: Negative for difficulty urinating and frequency.   Musculoskeletal: Positive for arthralgias.  Skin: Negative for itching and rash.  Neurological: Negative for extremity weakness.  Hematological: Negative for adenopathy.  Psychiatric/Behavioral: Negative for confusion.    MEDICAL HISTORY:  Past Medical History:  Diagnosis Date  . Cancer ([place]) 07/31/2019   Breast CA lumpectomy, radiation  09/13/19-10/12/19  . Family history of  kidney cancer   . Family history of melanoma   . Family history of ovarian cancer   . Family history of prostate cancer   . Hyperlipidemia   . Hypertension 07/25/2019   Pt states hx white coat. BP only elevated at MD office.  . Psoriasis     SURGICAL HISTORY: Past Surgical History:  Procedure Laterality Date  . ABDOMINAL HYSTERECTOMY     partial- still has ovaries  . BREAST BIOPSY Left 06/01/2019   [place] bx mass at 1:00, venus marker, path pending  . BREAST BIOPSY Left 06/01/2019   [place] bx of LN, coil marker, path pending  . BREAST CYST ASPIRATION Right   . BREAST LUMPECTOMY,RADIO FREQ LOCALIZER,AXILLARY SENTINEL LYMPH NODE BIOPSY Left 07/31/2019   Procedure: BREAST LUMPECTOMY,RADIO FREQ LOCALIZER,AXILLARY SENTINEL LYMPH NODE BIOPSY;  Surgeon: Campbell Lerner, MD;  Location: [facility] ORS;  Service: General;  Laterality: Left;  . TONSILLECTOMY AND ADENOIDECTOMY      SOCIAL HISTORY: Social History   Socioeconomic History  . Marital status: Single    Spouse name: Not on file  . Number of children: Not on file  . Years of education: Not on file  . Highest education level: Not on file  Occupational History  . Occupation: [employer]   Tobacco Use  . Smoking status: Never Smoker  . Smokeless tobacco: Never Used  Vaping Use  . Vaping Use: Never used  Substance and Sexual Activity  . Alcohol use: Never  . Drug use: Never  . Sexual activity: Not on file  Other Topics Concern  . Not on file  Social History Narrative  . Not on file   Social Determinants of Health   Financial Resource Strain: Not on file  Food Insecurity: Not on file  Transportation Needs: Not on file  Physical Activity: Not on file  Stress: Not on file  Social Connections: Not on file  Intimate Partner Violence: Not on file    FAMILY HISTORY: Family History  Problem Relation Age of Onset  . Lung cancer Mother 4       smoker  . Melanoma Mother 31  . Ovarian cancer Sister 35  . Emphysema Father    . COPD Father   . Prostate cancer Father   . Kidney cancer Brother 19  . Cancer Maternal Grandmother        possibly due to tannery company  . Cancer Maternal Grandfather        possibly due to tannery   . Heart disease Paternal Grandmother   . Hypertension Paternal Grandmother   . Heart disease Brother   . Heart Problems Maternal Uncle   . Breast cancer Neg Hx     ALLERGIES:  is allergic to codeine.  MEDICATIONS:  Current Outpatient Medications  Medication Sig Dispense Refill  . acetaminophen (TYLENOL) 500 MG tablet Take 500 mg by mouth daily as needed for moderate pain or headache.     . anastrozole (ARIMIDEX) 1 MG tablet Take 1 tablet (1 mg total) by mouth daily. 90 tablet 1  . Apremilast (OTEZLA) 30 MG TABS Take 1 tablet (30 mg total) by mouth 2 (two) times daily. 60 tablet 6  . atorvastatin (LIPITOR) 20 MG tablet TAKE 1 TABLET BY MOUTH EVERY DAY 90 tablet 0  . Chlorpheniramine-DM (CORICIDIN HBP COUGH/COLD PO) Take 1 tablet by mouth daily as needed (congestion).     . ondansetron (ZOFRAN) 4 MG tablet Take 1 tablet (4 mg total) by  mouth every 8 (eight) hours as needed for nausea or vomiting. 60 tablet 1   No current facility-administered medications for this visit.     PHYSICAL EXAMINATION: ECOG PERFORMANCE STATUS: 1 - Symptomatic but completely ambulatory Vitals:   03/22/20 1438  BP: (!) 182/100  Pulse: 87  Resp: 18  Temp: 98.3 F (36.8 C)   Filed Weights   03/22/20 1438  Weight: 140 lb (63.5 kg)    Physical Exam Constitutional:      General: She is not in acute distress. HENT:     Head: Normocephalic and atraumatic.  Eyes:     General: No scleral icterus. Cardiovascular:     Rate and Rhythm: Normal rate and regular rhythm.     Heart sounds: Normal heart sounds.  Pulmonary:     Effort: Pulmonary effort is normal. No respiratory distress.     Breath sounds: No wheezing.  Abdominal:     General: Bowel sounds are normal. There is no distension.      Palpations: Abdomen is soft.  Musculoskeletal:        General: No deformity. Normal range of motion.     Cervical back: Normal range of motion and neck supple.  Skin:    General: Skin is warm and dry.     Findings: No erythema or rash.  Neurological:     Mental Status: She is alert. Mental status is at baseline.     Cranial Nerves: No cranial nerve deficit.     Coordination: Coordination normal.  Psychiatric:        Mood and Affect: Mood normal.     LABORATORY DATA:  I have reviewed the data as listed Lab Results  Component Value Date   WBC 6.4 03/22/2020   HGB 15.6 (H) 03/22/2020   HCT 44.8 03/22/2020   MCV 94.1 03/22/2020   PLT 277 03/22/2020   Recent Labs    05/29/19 1136 05/29/19 1136 06/08/19 1444 06/13/19 1035 07/27/19 0905 08/22/19 1135 12/01/19 1010 03/22/20 1416  NA 139   < > 137  --  140  --  136 135  K 4.5  --  3.7  --  3.3*  --  3.8 3.3*  CL 99  --  103  --  105  --  101 101  CO2 26  --  25  --  24  --  27 23  GLUCOSE 80   < > 99  --  117*  --  87 98  BUN 13   < > 19  --  12  --  12 13  CREATININE 0.84  --  0.80  --  0.81  --  0.68 0.62  CALCIUM 10.7*  --  10.6*   < > 11.0* 11.0* 11.2* 10.7*  GFRNONAA 75  --  >60  --  >60  --  >60 >60  GFRAA 86  --  >60  --  >60  --   --   --   PROT 6.8   < > 7.6  --  7.3  --  7.4 7.0  ALBUMIN 4.4   < > 4.7  --  4.3  --  4.4 4.3  AST 25  --  23  --  24  --  25 21  ALT 18  --  21  --  16  --  19 18  ALKPHOS 89  --  74  --  66  --  75 72  BILITOT 0.4   < > 0.6  --  0.8  --  0.5 0.8   < > = values in this interval not displayed.   Iron/TIBC/Ferritin/ %Sat No results found for: IRON, TIBC, FERRITIN, IRONPCTSAT    RADIOGRAPHIC STUDIES: I have personally reviewed the radiological images as listed and agreed with the findings in the report. No results found.    ASSESSMENT & PLAN:  1. Malignant neoplasm of upper-outer quadrant of left breast in female, estrogen receptor positive ([place])   2. Hypertension,  unspecified type   3. Hypercalcemia   4. Erythrocytosis   Cancer Staging Malignant neoplasm of upper-outer quadrant of left breast in female, estrogen receptor positive ([place]) Staging form: Breast, AJCC 8th Edition - Clinical: No stage assigned - Unsigned - Pathologic: Stage IA (pT1c, pN0, cM0, G1, ER+, PR+, HER2-, Oncotype DX score: 16) - Signed by Earlie Server, MD on 08/22/2019   Left breast cancer, stage I pT1c pN0 invasive carcinoma, nonspecific type grade 2, ER 90%, PR 1-10% HER-2 equivocal by IHC.  Negative by FISH Oncotype DX recurrence score is 16, no chemotherapy benefit.  Status post adjuvant radiation Labs reviewed and discussed with patient. Continue Arimidex 1 mg daily.    #Osteopenia.  11/30/2019 bone density showed osteopenia, right femur neck -1.9 10-year probability of fracture 10.1%, hip fracture 1.3%. Discussed with patient about bisphosphonate treatments.  Patient has scheduled a dental appointment  #Hypercalcemia, normal PTH, normal PTH RP, patient is not on oral calcium supplementation, not on thiazide.  Multiple myeloma panel showed no monoclonal proteins.Etiology of hypercalcemia is unknown. Not clear if hypercalcemia is related or not related to her breast cancer. Check angiotensin converting enzyme.  Increased calcium/creatinine ratio. Attempt to obtain CT scanning for further evaluation however was denied by insurance. We discussed about age-appropriate cancer screening with colonoscopy which patient has scheduled. Referred to endocrinologist and she has an appointment.  #Hypertension, Check 24-hour urine catecholamines and metanephrine level.  #Erythrocytosis, hemoglobin 15.6.Marland Kitchen  Recommend sleep study.  Patient further discussed with primary care provider. #Mild hypokalemia, encourage patient to increase potassium enriched food intake.  Orders Placed This Encounter  Procedures  . Metanephrines, urine, 24 hour    Standing Status:   Future    Standing Expiration  Date:   03/22/2021  . Catecholamines, fractionated, urine, 24 hour    Standing Status:   Future    Standing Expiration Date:   03/22/2021  . CBC with Differential/Platelet    Standing Status:   Future    Standing Expiration Date:   03/22/2021  . Comprehensive metabolic panel    Standing Status:   Future    Standing Expiration Date:   03/22/2021    All questions were answered. The patient knows to call the clinic with any problems questions or concerns.  Return of visit: 6 months Earlie Server, MD, PhD Hematology Oncology [facility] Pager- [phone] 03/23/2020

## 2020-03-24 DIAGNOSIS — C50412 Malignant neoplasm of upper-outer quadrant of left female breast: Secondary | ICD-10-CM | POA: Diagnosis not present

## 2020-03-25 ENCOUNTER — Other Ambulatory Visit: Payer: Self-pay

## 2020-03-25 DIAGNOSIS — I10 Essential (primary) hypertension: Secondary | ICD-10-CM

## 2020-03-26 ENCOUNTER — Other Ambulatory Visit: Payer: Self-pay

## 2020-03-26 DIAGNOSIS — C50412 Malignant neoplasm of upper-outer quadrant of left female breast: Secondary | ICD-10-CM

## 2020-03-26 DIAGNOSIS — I10 Essential (primary) hypertension: Secondary | ICD-10-CM

## 2020-03-26 DIAGNOSIS — Z170 Estrogen receptor positive status [ER+]: Secondary | ICD-10-CM

## 2020-04-01 LAB — METANEPHRINES, URINE, 24 HOUR
Metaneph Total, Ur: 56 ug/L
Metanephrines, 24H Ur: 168 ug/24 hr (ref 36–209)
Normetanephrine, 24H Ur: 345 ug/24 hr (ref 131–612)
Normetanephrine, Ur: 115 ug/L
Total Volume: 3000

## 2020-04-08 LAB — MISC LABCORP TEST (SEND OUT): Labcorp test code: 286161

## 2020-04-12 ENCOUNTER — Ambulatory Visit
Admission: RE | Admit: 2020-04-12 | Discharge: 2020-04-12 | Disposition: A | Discharge status: Home / Self Care | Payer: Medicaid Other | Source: Ambulatory Visit | Attending: Radiation Oncology | Admitting: Radiation Oncology

## 2020-04-12 ENCOUNTER — Encounter: Payer: Self-pay | Admitting: Radiation Oncology

## 2020-04-12 VITALS — BP 158/93 | HR 88 | Temp 97.5°F | Wt 138.8 lb

## 2020-04-12 DIAGNOSIS — C50412 Malignant neoplasm of upper-outer quadrant of left female breast: Secondary | ICD-10-CM

## 2020-04-12 DIAGNOSIS — Z170 Estrogen receptor positive status [ER+]: Secondary | ICD-10-CM

## 2020-04-12 NOTE — Progress Notes (Signed)
Radiation Oncology Follow up Note  Name: Cynthia Nelson   Date:   04/12/2020 MRN:  [id number] DOB: 12/15/1955    This 65 y.o. female presents to the clinic today for 36-month follow-up status post whole breast radiation to her left breast for stage Ia (T1c N0 M0) ER/PR positive invasive mammary carcinoma.  REFERRING PROVIDER: Center, [facility]  HPI: Patient is a 65 year old female now out 6 months having completed whole breast radiation to her left breast for stage Ia invasive mammary carcinoma.  Seen today in routine follow-up she is doing well.  She specifically denies breast tenderness cough or bone pain..  She is not yet had follow-up mammograms.  She is currently on Arimidex tolerant well without side effect.  COMPLICATIONS OF TREATMENT: none  FOLLOW UP COMPLIANCE: keeps appointments   PHYSICAL EXAM:  BP (!) 158/93   Pulse 88   Temp (!) 97.5 F (36.4 C) (Tympanic)   Wt 138 lb 12.8 oz (63 kg)   BMI 26.23 kg/m  Lungs are clear to A&P cardiac examination essentially unremarkable with regular rate and rhythm. No dominant mass or nodularity is noted in either breast in 2 positions examined. Incision is well-healed. No axillary or supraclavicular adenopathy is appreciated. Cosmetic result is excellent.  Well-developed well-nourished patient in NAD. HEENT reveals PERLA, EOMI, discs not visualized.  Oral cavity is clear. No oral mucosal lesions are identified. Neck is clear without evidence of cervical or supraclavicular adenopathy. Lungs are clear to A&P. Cardiac examination is essentially unremarkable with regular rate and rhythm without murmur rub or thrill. Abdomen is benign with no organomegaly or masses noted. Motor sensory and DTR levels are equal and symmetric in the upper and lower extremities. Cranial nerves II through XII are grossly intact. Proprioception is intact. No peripheral adenopathy or edema is identified. No motor or sensory levels are noted. Crude visual fields are  within normal range.  RADIOLOGY RESULTS: No current films for review  PLAN: Present time patient is doing well with no evidence of disease 6 months out from whole breast radiation and pleased with her overall progress.  I have asked to see her out in 6 months for follow-up.  She is already scheduled for follow-up MRI scans and mammograms of her breast.  Patient knows to call at anytime with any concerns she continues on Arimidex without side effect.  I would like to take this opportunity to thank you for allowing me to participate in the care of your patient.Noreene Filbert, MD

## 2020-04-25 ENCOUNTER — Other Ambulatory Visit: Payer: Self-pay

## 2020-04-25 DIAGNOSIS — Z170 Estrogen receptor positive status [ER+]: Secondary | ICD-10-CM

## 2020-04-25 DIAGNOSIS — C50412 Malignant neoplasm of upper-outer quadrant of left female breast: Secondary | ICD-10-CM

## 2020-05-29 ENCOUNTER — Other Ambulatory Visit: Payer: Medicaid Other

## 2020-05-31 ENCOUNTER — Ambulatory Visit
Admission: RE | Admit: 2020-05-31 | Discharge: 2020-05-31 | Disposition: A | Discharge status: Home / Self Care | Payer: Medicaid Other | Source: Ambulatory Visit | Attending: Surgery | Admitting: Surgery

## 2020-05-31 ENCOUNTER — Other Ambulatory Visit: Payer: Self-pay

## 2020-05-31 DIAGNOSIS — C50412 Malignant neoplasm of upper-outer quadrant of left female breast: Secondary | ICD-10-CM

## 2020-05-31 DIAGNOSIS — Z170 Estrogen receptor positive status [ER+]: Secondary | ICD-10-CM

## 2020-06-04 ENCOUNTER — Other Ambulatory Visit: Payer: Self-pay

## 2020-06-04 ENCOUNTER — Ambulatory Visit: Payer: Medicaid Other | Admitting: Surgery

## 2020-06-04 ENCOUNTER — Ambulatory Visit (INDEPENDENT_AMBULATORY_CARE_PROVIDER_SITE_OTHER): Payer: Medicaid Other | Admitting: Surgery

## 2020-06-04 ENCOUNTER — Encounter: Payer: Self-pay | Admitting: Surgery

## 2020-06-04 VITALS — BP 164/93 | HR 80 | Temp 98.2°F | Ht 61.0 in | Wt 139.2 lb

## 2020-06-04 DIAGNOSIS — Z170 Estrogen receptor positive status [ER+]: Secondary | ICD-10-CM | POA: Diagnosis not present

## 2020-06-04 DIAGNOSIS — C50412 Malignant neoplasm of upper-outer quadrant of left female breast: Secondary | ICD-10-CM | POA: Diagnosis not present

## 2020-06-04 NOTE — Progress Notes (Signed)
[facility] POST-OP OFFICE VISIT  06/04/2020  HPI: Cynthia Nelson is a 65 y.o. female 10 months s/p left upper outer quadrant lumpectomy with sentinel lymph node biopsy. She has the expected shoulder"tightness" and good range of motion. She appears to be tolerating her anastrozole well, and has recovered nicely from her radiation changes.  She has no issues.  Delighted to have a good report with her recent mammogram.  Vital signs: BP (!) 164/93   Pulse 80   Temp 98.2 F (36.8 C) (Oral)   Ht _0  (1.549 m)   Wt 139 lb 3.2 oz (63.1 kg)   SpO2 97%   BMI 26.30 kg/m    Physical Exam: Constitutional: She appears well Left breast: Upper quadrant scar appears clean, fine.  No densities, dimpling or retraction appreciated.  Expected mild skin changes.  No suspicious nodularity or masses present in either breast.  Assessment/Plan: This is a 65 y.o. female 10 months s/p left upper quadrant/axillary lumpectomy with sentinel lymph node biopsy.  Malignant neoplasm of upper-outer quadrant of left breast in female, estrogen receptor positive ([place]) Staging form: Breast, AJCC 8th Edition - Clinical: No stage assigned - Unsigned - Pathologic: Stage IA (pT1c, pN0, cM0, G1, ER+, PR+, HER2-, Oncotype DX score: 16)  Patient Active Problem List   Diagnosis Date Noted  . Erythrocytosis 12/01/2019  . Genetic testing 06/21/2019  . Goals of care, counseling/discussion 06/20/2019  . Family history of ovarian cancer   . Family history of kidney cancer   . Family history of melanoma   . Family history of prostate cancer   . Hypercalcemia 06/09/2019  . Family history of cancer 06/09/2019  . Lobular carcinoma of left breast ([place]) 06/09/2019  . Invasive carcinoma of breast ([place]) 06/09/2019  . Malignant neoplasm of upper-outer quadrant of left breast in female, estrogen receptor positive ([place]) 06/08/2019  . Essential hypertension 05/29/2019  . Psoriasis   . Juvenile rheumatoid arthritis  ([place]) 09/02/2018  . History of duodenal ulcer 09/02/2018  . 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  Happy to see her in the interim for any reason.   Ronny Bacon M.D., FACS 06/04/2020, 9:54 AM

## 2020-06-04 NOTE — Patient Instructions (Signed)
If you have any concerns or questions, please feel free to call our office.    Breast Self-Awareness Breast self-awareness is knowing how your breasts look and feel. Doing breast self-awareness is important. It allows you to catch a breast problem early while it is still small and can be treated. All women should do breast self-awareness, including women who have had breast implants. Tell your doctor if you notice a change in your breasts. What you need:  A mirror.  A well-lit room. How to do a breast self-exam A breast self-exam is one way to learn what is normal for your breasts and to check for changes. To do a breast self-exam: Look for changes 1. Take off all the clothes above your waist. 2. Stand in front of a mirror in a room with good lighting. 3. Put your hands on your hips. 4. Push your hands down. 5. Look at your breasts and nipples in the mirror to see if one breast or nipple looks different from the other. Check to see if: ? The shape of one breast is different. ? The size of one breast is different. ? There are wrinkles, dips, and bumps in one breast and not the other. 6. Look at each breast for changes in the skin, such as: ? Redness. ? Scaly areas. 7. Look for changes in your nipples, such as: ? Liquid around the nipples. ? Bleeding. ? Dimpling. ? Redness. ? A change in where the nipples are.   Feel for changes 1. Lie on your back on the floor. 2. Feel each breast. To do this, follow these steps: ? Pick a breast to feel. ? Put the arm closest to that breast above your head. ? Use your other arm to feel the nipple area of your breast. Feel the area with the pads of your three middle fingers by making small circles with your fingers. For the first circle, press lightly. For the second circle, press harder. For the third circle, press even harder. ? Keep making circles with your fingers at the different pressures as you move down your breast. Stop when you feel your  ribs. ? Move your fingers a little toward the center of your body. ? Start making circles with your fingers again, this time going up until you reach your collarbone. ? Keep making up-and-down circles until you reach your armpit. Remember to keep using the three pressures. ? Feel the other breast in the same way. 3. Sit or stand in the tub or shower. 4. With soapy water on your skin, feel each breast the same way you did in step 2 when you were lying on the floor.   Write down what you find Writing down what you find can help you remember what to tell your doctor. Write down:  What is normal for each breast.  Any changes you find in each breast, including: ? The kind of changes you find. ? Whether you have pain. ? Size and location of any lumps.  When you last had your menstrual period. General tips  Check your breasts every month.  If you are breastfeeding, the best time to check your breasts is after you feed your baby or after you use a breast pump.  If you get menstrual periods, the best time to check your breasts is 5-7 days after your menstrual period is over.  With time, you will become comfortable with the self-exam, and you will begin to know if there are changes in your  breasts. Contact a doctor if you:  See a change in the shape or size of your breasts or nipples.  See a change in the skin of your breast or nipples, such as red or scaly skin.  Have fluid coming from your nipples that is not normal.  Find a lump or thick area that was not there before.  Have pain in your breasts.  Have any concerns about your breast health. Summary  Breast self-awareness includes looking for changes in your breasts, as well as feeling for changes within your breasts.  Breast self-awareness should be done in front of a mirror in a well-lit room.  You should check your breasts every month. If you get menstrual periods, the best time to check your breasts is 5-7 days after your  menstrual period is over.  Let your doctor know of any changes you see in your breasts, including changes in size, changes on the skin, pain or tenderness, or fluid from your nipples that is not normal. This information is not intended to replace advice given to you by your health care provider. Make sure you discuss any questions you have with your health care provider. Document Revised: 08/24/2017 Document Reviewed: 08/24/2017 Elsevier Patient Education  [place].

## 2020-06-06 ENCOUNTER — Ambulatory Visit: Payer: No Typology Code available for payment source | Admitting: Dermatology

## 2020-06-06 ENCOUNTER — Other Ambulatory Visit: Payer: Self-pay

## 2020-06-06 DIAGNOSIS — L409 Psoriasis, unspecified: Secondary | ICD-10-CM | POA: Diagnosis not present

## 2020-06-06 NOTE — Patient Instructions (Signed)

## 2020-06-06 NOTE — Progress Notes (Signed)
   Follow-Up Visit   Subjective  Cynthia Nelson is a 65 y.o. female who presents for the following: Psoriasis (Of the scalp, hands, and legs - currently controlled on Otezla 30mg  po BID. Patient states she has no s/e from medication. She does have occasional itching of her palms but no other active areas of psoriasis).  The following portions of the chart were reviewed this encounter and updated as appropriate:   Tobacco  Allergies  Meds  Problems  Med Hx  Surg Hx  Fam Hx     Review of Systems:  No other skin or systemic complaints except as noted in HPI or Assessment and Plan.  Objective  Well appearing patient in no apparent distress; mood and affect are within normal limits.  A focused examination was performed including the scalp, hands, and legs. Relevant physical exam findings are noted in the Assessment and Plan.  Objective  Scalp, hands, legs: Clear.   Assessment & Plan  Psoriasis Scalp, hands, legs Chronic, persistent, but controlled on Otezla -  Psoriasis is a chronic non-curable, but treatable genetic/hereditary disease that may have other systemic features affecting other organ systems such as joints (Psoriatic Arthritis). It is associated with an increased risk of inflammatory bowel disease, heart disease, non-alcoholic fatty liver disease, and depression.    Continue Otezla 30mg  po BID.   Side effects of Otezla (apremilast) include diarrhea, nausea, headache, upper respiratory infection, depression, and weight decrease (5-10%). It should only be taken by pregnant women after a discussion regarding risks and benefits with their doctor. Goal is control of skin condition, not cure.  The use of [name] requires long term medication management, including periodic office visits.  Recommend CeraVe cream or Sarna lotion to help with itch.   Other Related Medications Apremilast (OTEZLA) 30 MG TABS  Return in about 6 months (around 12/07/2020) for psoriasis follow up  .  Luther Redo, CMA, am acting as scribe for Sarina Ser, MD .  Documentation: I have reviewed the above documentation for accuracy and completeness, and I agree with the above.  Sarina Ser, MD

## 2020-06-14 ENCOUNTER — Encounter: Payer: Self-pay | Admitting: Dermatology

## 2020-06-25 ENCOUNTER — Other Ambulatory Visit: Payer: Self-pay

## 2020-06-25 DIAGNOSIS — L409 Psoriasis, unspecified: Secondary | ICD-10-CM

## 2020-06-25 MED ORDER — OTEZLA 30 MG PO TABS: 30.0000 mg | ORAL_TABLET | Freq: Two times a day (BID) | ORAL | 6 refills | Status: DC

## 2020-06-25 NOTE — Progress Notes (Signed)
Fax for prescription renewal received.

## 2020-09-19 ENCOUNTER — Other Ambulatory Visit: Payer: Self-pay | Admitting: Oncology

## 2020-09-30 ENCOUNTER — Inpatient Hospital Stay: Payer: Medicaid Other

## 2020-09-30 ENCOUNTER — Inpatient Hospital Stay: Payer: Medicaid Other | Attending: Oncology | Admitting: Oncology

## 2020-09-30 ENCOUNTER — Encounter: Payer: Self-pay | Admitting: Oncology

## 2020-09-30 VITALS — BP 155/96 | HR 79 | Temp 98.9°F | Resp 18 | Wt 136.7 lb

## 2020-09-30 DIAGNOSIS — Z170 Estrogen receptor positive status [ER+]: Secondary | ICD-10-CM | POA: Diagnosis not present

## 2020-09-30 DIAGNOSIS — D751 Secondary polycythemia: Secondary | ICD-10-CM

## 2020-09-30 DIAGNOSIS — I10 Essential (primary) hypertension: Secondary | ICD-10-CM | POA: Diagnosis not present

## 2020-09-30 DIAGNOSIS — C50912 Malignant neoplasm of unspecified site of left female breast: Secondary | ICD-10-CM

## 2020-09-30 DIAGNOSIS — Z801 Family history of malignant neoplasm of trachea, bronchus and lung: Secondary | ICD-10-CM | POA: Insufficient documentation

## 2020-09-30 DIAGNOSIS — Z8041 Family history of malignant neoplasm of ovary: Secondary | ICD-10-CM | POA: Diagnosis not present

## 2020-09-30 DIAGNOSIS — C50412 Malignant neoplasm of upper-outer quadrant of left female breast: Secondary | ICD-10-CM

## 2020-09-30 DIAGNOSIS — M8580 Other specified disorders of bone density and structure, unspecified site: Secondary | ICD-10-CM | POA: Diagnosis not present

## 2020-09-30 DIAGNOSIS — Z8051 Family history of malignant neoplasm of kidney: Secondary | ICD-10-CM | POA: Diagnosis not present

## 2020-09-30 DIAGNOSIS — Z79899 Other long term (current) drug therapy: Secondary | ICD-10-CM | POA: Insufficient documentation

## 2020-09-30 DIAGNOSIS — Z79811 Long term (current) use of aromatase inhibitors: Secondary | ICD-10-CM | POA: Diagnosis not present

## 2020-09-30 LAB — CBC WITH DIFFERENTIAL/PLATELET
Abs Immature Granulocytes: 0.01 10*3/uL (ref 0.00–0.07)
Basophils Absolute: 0.1 10*3/uL (ref 0.0–0.1)
Basophils Relative: 1 %
Eosinophils Absolute: 0.2 10*3/uL (ref 0.0–0.5)
Eosinophils Relative: 3 %
HCT: 46.6 % — ABNORMAL HIGH (ref 36.0–46.0)
Hemoglobin: 16.1 g/dL — ABNORMAL HIGH (ref 12.0–15.0)
Immature Granulocytes: 0 %
Lymphocytes Relative: 19 %
Lymphs Abs: 1.2 10*3/uL (ref 0.7–4.0)
MCH: 33.2 pg (ref 26.0–34.0)
MCHC: 34.5 g/dL (ref 30.0–36.0)
MCV: 96.1 fL (ref 80.0–100.0)
Monocytes Absolute: 0.6 10*3/uL (ref 0.1–1.0)
Monocytes Relative: 10 %
Neutro Abs: 4.1 10*3/uL (ref 1.7–7.7)
Neutrophils Relative %: 67 %
Platelets: 288 10*3/uL (ref 150–400)
RBC: 4.85 MIL/uL (ref 3.87–5.11)
RDW: 12.2 % (ref 11.5–15.5)
WBC: 6.2 10*3/uL (ref 4.0–10.5)
nRBC: 0 % (ref 0.0–0.2)

## 2020-09-30 LAB — COMPREHENSIVE METABOLIC PANEL
ALT: 18 U/L (ref 0–44)
AST: 27 U/L (ref 15–41)
Albumin: 4.4 g/dL (ref 3.5–5.0)
Alkaline Phosphatase: 104 U/L (ref 38–126)
Anion gap: 9 (ref 5–15)
BUN: 11 mg/dL (ref 8–23)
CO2: 28 mmol/L (ref 22–32)
Calcium: 10.7 mg/dL — ABNORMAL HIGH (ref 8.9–10.3)
Chloride: 101 mmol/L (ref 98–111)
Creatinine, Ser: 0.82 mg/dL (ref 0.44–1.00)
GFR, Estimated: >60 mL/min (ref >60)
Glucose, Bld: 98 mg/dL (ref 70–99)
Potassium: 3.9 mmol/L (ref 3.5–5.1)
Sodium: 138 mmol/L (ref 135–145)
Total Bilirubin: 0.6 mg/dL (ref 0.3–1.2)
Total Protein: 7.1 g/dL (ref 6.5–8.1)

## 2020-09-30 NOTE — Progress Notes (Signed)
Patient here for follow up. Pt reports no new concerns or breast problems.

## 2020-09-30 NOTE — Progress Notes (Signed)
Hematology/Oncology note [facility] Telephone:([phone] [phone]   Patient Care Team: Center, [facility] as PCP - General (General Practice) Ronny Bacon, MD as Consulting Physician (General Surgery) Earlie Server, MD as Consulting Physician (Oncology) Theodore Demark, RN as Oncology Nurse Navigator Noreene Filbert, MD as Referring Physician (Radiation Oncology)  REFERRING PROVIDER: Center, [place] COMPLAINTS/REASON FOR VISIT:   Follow-up for breast cancer  HISTORY OF PRESENTING ILLNESS:   Cynthia Nelson is a  65 y.o.  female with PMH listed below was seen in consultation at the request of  Center, Jenny Reichmann*  for evaluation of breast cancer  She self palpated left breast mass 2 months.  Denies any nipple discharge, breast skin changes or breast pain. 05/29/2019 bilateral diagnostic mammogram showed a highly suspicious 1.5 cm mass involving the upper outer quadrant of the left breast, associated with architectural distortion.  Accounting for the palpable concern. Solitary abnormal left axillary lymph node with cortical thickening up to 9 mm.  Recent COVID-19 vaccination in the left arm about 4 weeks prior to the examination.  Patient underwent biopsy of the left breast mass and left axillary lymph node. 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 Grade 2, lymph node biopsy was negative for metastatic carcinoma. ER 90% PR 1 to 10%, HER-2 equivocal by IHC.  HER-2 FISH is pending.  Menarche age of 84, Patient has a history of birth control  Use for less than a year. She is single, no children.  No previous pregnancy. Partial hysterectomy due to fibroid disease in 2005. Previous breast biopsy 2003 FNA right breast, cyst aspiration. Denies any chest radiation.  Family history of cancer, She reports a family history significant for sister deceased from  ovarian cancer at age of 62, brother had kidney cancer.  Mother who was a smoker deceased from lung cancer. Grandfather and grand mother had history of cancer.  Details not available. Genetic testing was done which showed HOXB13 and POLE VUS, no pathogenic mutation.  07/31/2019, underwent left lumpectomy with sentinel lymph node biopsy. 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  Margins were negative for both invasive carcinoma and DCIS pT1c pN0 September 2021, patient finished adjuvant radiation.  10/25/2019, patient started on Arimidex 1 mg daily.  INTERVAL HISTORY Cynthia Dettmannis a 64y.o. female who has above history reviewed by me today presents for follow up visit for management of breast cancer Problems and complaints are listed below: She tolerates well. 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  Otherwise doing well.   Review of Systems  Constitutional:  Negative for appetite change, chills, fatigue and fever.  HENT:   Negative for hearing loss and voice change.   Eyes:  Negative for eye problems.  Respiratory:  Negative for chest tightness and cough.   Cardiovascular:  Negative for chest pain.  Gastrointestinal:  Negative for abdominal distention, abdominal pain, blood in stool and nausea.  Endocrine: Negative for hot flashes.  Genitourinary:  Negative for difficulty urinating and frequency.   Musculoskeletal:  Positive for arthralgias.  Skin:  Negative for itching and rash.  Neurological:  Negative for extremity weakness.  Hematological:  Negative for adenopathy.  Psychiatric/Behavioral:  Negative for confusion.    MEDICAL HISTORY:  Past Medical History:  Diagnosis Date  Cancer ([place]) 07/31/2019   Breast CA lumpectomy, radiation 09/13/19-10/12/19   Family history of kidney cancer     Family history of melanoma    Family history of ovarian cancer    Family history of prostate cancer    Hyperlipidemia    Hypertension 07/25/2019   Pt states hx white coat. BP only elevated at MD office.   Psoriasis     SURGICAL HISTORY: Past Surgical History:  Procedure Laterality Date   ABDOMINAL HYSTERECTOMY     partial- still has ovaries   BREAST BIOPSY Left 06/01/2019   [place] bx mass at 1:00, venus marker, path pending   BREAST BIOPSY Left 06/01/2019   [place] bx of LN, coil marker, path pending   BREAST CYST ASPIRATION Right    BREAST LUMPECTOMY Left 07/2019   [facility] lumpectomy with rad no chemo   BREAST LUMPECTOMY,RADIO FREQ LOCALIZER,AXILLARY SENTINEL LYMPH NODE BIOPSY Left 07/31/2019   Procedure: BREAST LUMPECTOMY,RADIO FREQ LOCALIZER,AXILLARY SENTINEL LYMPH NODE BIOPSY;  Surgeon: Ronny Bacon, MD;  Location: [facility] ORS;  Service: General;  Laterality: Left;   TONSILLECTOMY AND ADENOIDECTOMY      SOCIAL HISTORY: Social History   Socioeconomic History   Marital status: Single    Spouse name: Not on file   Number of children: Not on file   Years of education: Not on file   Highest education level: Not on file  Occupational History   Occupation: [employer]   Tobacco Use   Smoking status: Never   Smokeless tobacco: Never  Vaping Use   Vaping Use: Never used  Substance and Sexual Activity   Alcohol use: Never   Drug use: Never   Sexual activity: Not on file  Other Topics Concern   Not on file  Social History Narrative   Not on file   Social Determinants of Health   Financial Resource Strain: Not on file  Food Insecurity: Not on file  Transportation Needs: Not on file  Physical Activity: Not on file  Stress: Not on file  Social Connections: Not on file  Intimate Partner Violence: Not on file    FAMILY HISTORY: Family History  Problem Relation Age of Onset   Lung cancer Mother 36       smoker   Melanoma Mother 75   Ovarian cancer Sister 2    Emphysema Father    COPD Father    Prostate cancer Father    Kidney cancer Brother 29   Cancer Maternal Grandmother        possibly due to [place] Maternal Grandfather        possibly due to tannery    Heart disease Paternal Grandmother    Hypertension Paternal Grandmother    Heart disease Brother    Heart Problems Maternal Uncle    Breast cancer Neg Hx     ALLERGIES:  is allergic to codeine.  MEDICATIONS:  Current Outpatient Medications  Medication Sig Dispense Refill   anastrozole (ARIMIDEX) 1 MG tablet TAKE 1 TABLET BY MOUTH EVERY DAY 90 tablet 0   Apremilast (OTEZLA) 30 MG TABS Take 1 tablet (30 mg total) by mouth 2 (two) times daily. 60 tablet 6   atorvastatin (LIPITOR) 20 MG tablet TAKE 1 TABLET BY MOUTH EVERY DAY 90 tablet 0   acetaminophen (TYLENOL) 500 MG tablet Take 500 mg by mouth daily as needed for moderate pain or headache. (Patient not taking: Reported on 09/30/2020)     Chlorpheniramine-DM (CORICIDIN HBP COUGH/COLD PO) Take 1  tablet by mouth daily as needed (congestion). (Patient not taking: No sig reported)     No current facility-administered medications for this visit.     PHYSICAL EXAMINATION: ECOG PERFORMANCE STATUS: 1 - Symptomatic but completely ambulatory Vitals:   09/30/20 1015  BP: (!) 155/96  Pulse: 79  Resp: 18  Temp: 98.9 F (37.2 C)   Filed Weights   09/30/20 1015  Weight: 136 lb 11.2 oz (62 kg)    Physical Exam Constitutional:      General: She is not in acute distress. HENT:     Head: Normocephalic and atraumatic.  Eyes:     General: No scleral icterus. Cardiovascular:     Rate and Rhythm: Normal rate and regular rhythm.     Heart sounds: Normal heart sounds.  Pulmonary:     Effort: Pulmonary effort is normal. No respiratory distress.     Breath sounds: No wheezing.  Abdominal:     General: Bowel sounds are normal. There is no distension.     Palpations: Abdomen is soft.  Musculoskeletal:        General: No  deformity. Normal range of motion.     Cervical back: Normal range of motion and neck supple.  Skin:    General: Skin is warm and dry.     Findings: No erythema or rash.  Neurological:     Mental Status: She is alert. Mental status is at baseline.     Cranial Nerves: No cranial nerve deficit.     Coordination: Coordination normal.  Psychiatric:        Mood and Affect: Mood normal.    LABORATORY DATA:  I have reviewed the data as listed Lab Results  Component Value Date   WBC 6.2 09/30/2020   HGB 16.1 (H) 09/30/2020   HCT 46.6 (H) 09/30/2020   MCV 96.1 09/30/2020   PLT 288 09/30/2020   Recent Labs    12/01/19 1010 03/22/20 1416 09/30/20 1058  NA 136 135 138  K 3.8 3.3* 3.9  CL 101 101 101  CO2 _0 GLUCOSE 87 98 98  BUN _1 CREATININE 0.68 0.62 0.82  CALCIUM 11.2* 10.7* 10.7*  GFRNONAA >60 >60 >60  PROT 7.4 7.0 7.1  ALBUMIN 4.4 4.3 4.4  AST _2 ALT _3 ALKPHOS 75 72 104  BILITOT 0.5 0.8 0.6    Iron/TIBC/Ferritin/ %Sat No results found for: IRON, TIBC, FERRITIN, IRONPCTSAT    RADIOGRAPHIC STUDIES: I have personally reviewed the radiological images as listed and agreed with the findings in the report. No results found.     ASSESSMENT & PLAN:  1. Malignant neoplasm of upper-outer quadrant of left breast in female, estrogen receptor positive ([place])   2. Lobular carcinoma of left breast ([place])   3. Hypercalcemia   4. Erythrocytosis   5. Use of anastrozole (Arimidex)   6. Osteopenia, unspecified location   Cancer Staging Malignant neoplasm of upper-outer quadrant of left breast in female, estrogen receptor positive ([place]) Staging form: Breast, AJCC 8th Edition - Clinical: No stage assigned - Unsigned - Pathologic: Stage IA (pT1c, pN0, cM0, G1, ER+, PR+, HER2-, Oncotype DX score: 16) - Signed by Earlie Server, MD on 08/22/2019   Left breast cancer, stage I pT1c pN0 invasive carcinoma, nonspecific type grade 2, ER 90%, PR 1-10% HER-2 equivocal  by IHC.  Negative by FISH Oncotype DX recurrence score is 16, no chemotherapy benefit.  Status post adjuvant radiation Labs are  reviewed and discussed with patient. Continue Arimidex 21m daily . 05/31/2020 mammogram negative.   #Osteopenia.  11/30/2019 bone density showed osteopenia, right femur neck -1.9 10-year probability of fracture 10.1%, hip fracture 1.3%. Discussed with patient about bisphosphonate treatments.   Awaiting for dental clearance and endocrinology clearance  #Hypercalcemia, normal PTH, normal PTH RP, patient is not on oral calcium supplementation, not on thiazide.  Multiple myeloma panel showed no monoclonal proteins. 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  #Hypertension, BP is stable today normal 24-hour urine catecholamines and metanephrine level.  #Erythrocytosis, hemoglobin 15.6..Marland Kitchen Recommend sleep study.  Patient further discussed with primary care provider.  Check lab today, cbc cmp tumor markers   All questions were answered. The patient knows to call the clinic with any problems questions or concerns.  Return of visit: 6 months ZEarlie Server MD, PhD Hematology Oncology CSil[facility]at ALas [facility] Specialty HospitalPager- [phone]/12/2020

## 2020-10-01 LAB — CANCER ANTIGEN 27.29: CA 27.29: 44.8 U/mL — ABNORMAL HIGH (ref 0.0–38.6)

## 2020-10-01 LAB — CANCER ANTIGEN 15-3: CA 15-3: 35.9 U/mL — ABNORMAL HIGH (ref 0.0–25.0)

## 2020-10-18 ENCOUNTER — Encounter: Payer: Self-pay | Admitting: Radiation Oncology

## 2020-10-18 ENCOUNTER — Ambulatory Visit
Admission: RE | Admit: 2020-10-18 | Discharge: 2020-10-18 | Disposition: A | Discharge status: Home / Self Care | Payer: Medicaid Other | Source: Ambulatory Visit | Attending: Radiation Oncology | Admitting: Radiation Oncology

## 2020-10-18 VITALS — BP 162/97 | HR 75 | Temp 96.7°F | Resp 16 | Wt 135.3 lb

## 2020-10-18 DIAGNOSIS — Z79811 Long term (current) use of aromatase inhibitors: Secondary | ICD-10-CM | POA: Insufficient documentation

## 2020-10-18 DIAGNOSIS — C50412 Malignant neoplasm of upper-outer quadrant of left female breast: Secondary | ICD-10-CM | POA: Insufficient documentation

## 2020-10-18 DIAGNOSIS — Z923 Personal history of irradiation: Secondary | ICD-10-CM | POA: Diagnosis not present

## 2020-10-18 DIAGNOSIS — Z170 Estrogen receptor positive status [ER+]: Secondary | ICD-10-CM | POA: Diagnosis not present

## 2020-10-18 NOTE — Progress Notes (Signed)
Radiation Oncology Follow up Note  Name: Cynthia Nelson   Date:   10/18/2020 MRN:  [id number] DOB: 10-Jun-1955    This 65 y.o. female presents to the clinic today for 1 year follow-up status post whole breast radiation to her left breast for stage Ia (T1 cN0 M0) ER/PR positive invasive mammary carcinoma.  REFERRING PROVIDER: Center, [facility]  HPI: Patient is a 65 year old female now out 12 months having completed whole breast radiation to her left breast for stage Ia ER/PR positive invasive mammary carcinoma.  Seen today in routine follow-up she is doing well.  She specifically denies breast tenderness cough or bone pain..  She is currently on Arimidex tolerating it well without side effect.  She had mammograms back in May which I have reviewed were BI-RADS 2 benign  COMPLICATIONS OF TREATMENT: none  FOLLOW UP COMPLIANCE: keeps appointments   PHYSICAL EXAM:  BP (!) 162/97 (BP Location: Right Arm, Patient Position: Sitting)   Pulse 75   Temp (!) 96.7 F (35.9 C) (Tympanic)   Resp 16   Wt 135 lb 4.8 oz (61.4 kg)   BMI 25.56 kg/m  Lungs are clear to A&P cardiac examination essentially unremarkable with regular rate and rhythm. No dominant mass or nodularity is noted in either breast in 2 positions examined. Incision is well-healed. No axillary or supraclavicular adenopathy is appreciated. Cosmetic result is excellent.  Well-developed well-nourished patient in NAD. HEENT reveals PERLA, EOMI, discs not visualized.  Oral cavity is clear. No oral mucosal lesions are identified. Neck is clear without evidence of cervical or supraclavicular adenopathy. Lungs are clear to A&P. Cardiac examination is essentially unremarkable with regular rate and rhythm without murmur rub or thrill. Abdomen is benign with no organomegaly or masses noted. Motor sensory and DTR levels are equal and symmetric in the upper and lower extremities. Cranial nerves II through XII are grossly intact. Proprioception is  intact. No peripheral adenopathy or edema is identified. No motor or sensory levels are noted. Crude visual fields are within normal range.  RADIOLOGY RESULTS: Mammograms reviewed compatible with above-stated findings  PLAN: Present time patient is doing well with no evidence of disease now at 1 year.  Of asked to see her back in 1 year for follow-up.  She continues on Arimidex without side effect.  Patient knows to call with any concerns.  I would like to take this opportunity to thank you for allowing me to participate in the care of your patient.Noreene Filbert, MD

## 2020-11-21 ENCOUNTER — Ambulatory Visit: Payer: Medicaid Other | Admitting: Dermatology

## 2020-11-21 ENCOUNTER — Other Ambulatory Visit: Payer: Self-pay

## 2020-11-21 DIAGNOSIS — Z79899 Other long term (current) drug therapy: Secondary | ICD-10-CM | POA: Diagnosis not present

## 2020-11-21 DIAGNOSIS — L409 Psoriasis, unspecified: Secondary | ICD-10-CM

## 2020-11-21 MED ORDER — OTEZLA 30 MG PO TABS: 30.0000 mg | ORAL_TABLET | Freq: Two times a day (BID) | ORAL | 6 refills | Status: DC

## 2020-11-21 NOTE — Progress Notes (Signed)
   Follow-Up Visit   Subjective  Cynthia Nelson is a 65 y.o. female who presents for the following: Follow-up (Patient here today for 6 month follow up on psoriasis. Patient is currently using otezla 30 mg twice daily and reports she is doing well. Some itchy spots but overall good. ).  Patient states Parathyroidectomy planned for tomorrow due to high calcium levels   The following portions of the chart were reviewed this encounter and updated as appropriate:  Tobacco  Allergies  Meds  Problems  Med Hx  Surg Hx  Fam Hx     Review of Systems: No other skin or systemic complaints except as noted in HPI or Assessment and Plan.  Objective  Well appearing patient in no apparent distress; mood and affect are within normal limits.  A focused examination was performed including scalp, legs, hands. Relevant physical exam findings are noted in the Assessment and Plan.  legs, scalp, hands Legs and hands are clear, scalp is clear   Assessment & Plan  Psoriasis legs, scalp, hands Chronic, persistent, but better controlled on oral Otezla -  Psoriasis is a chronic non-curable, but treatable genetic/hereditary disease that may have other systemic features affecting other organ systems such as joints (Psoriatic Arthritis). It is associated with an increased risk of inflammatory bowel disease, heart disease, non-alcoholic fatty liver disease, and depression.    Patient denies any headache, diarrhea, or depression  Continue Otezla 30mg  po BID.    Side effects of Otezla (apremilast) include diarrhea, nausea, headache, upper respiratory infection, depression, and weight decrease (5-10%). It should only be taken by pregnant women after a discussion regarding risks and benefits with their doctor. Goal is control of skin condition, not cure.  The use of [name] requires long term medication management, including periodic office visits.   Patient is using aquaphor cream to help with itch  Apremilast  (OTEZLA) 30 MG TABS - legs, scalp, hands Take 1 tablet (30 mg total) by mouth 2 (two) times daily.  Parathyroidectomy Planned for tomorrow due to elevated calcium  Return for 6 month follow up on psoriasis . Cynthia Nelson, CMA, am acting as scribe for Sarina Ser, MD. Documentation: I have reviewed the above documentation for accuracy and completeness, and I agree with the above.  Sarina Ser, MD

## 2020-11-21 NOTE — Patient Instructions (Signed)

## 2020-11-22 HISTORY — PX: PARATHYROIDECTOMY: SHX19

## 2020-11-25 ENCOUNTER — Encounter: Payer: Self-pay | Admitting: Dermatology

## 2020-11-28 ENCOUNTER — Other Ambulatory Visit: Payer: Self-pay

## 2020-11-28 DIAGNOSIS — L409 Psoriasis, unspecified: Secondary | ICD-10-CM

## 2020-11-28 MED ORDER — OTEZLA 30 MG PO TABS: 30.0000 mg | ORAL_TABLET | Freq: Two times a day (BID) | ORAL | 6 refills | Status: DC

## 2020-11-28 NOTE — Progress Notes (Signed)
[name] RX corrected to the right pharmacy. [facility] to [facility].

## 2020-12-15 ENCOUNTER — Other Ambulatory Visit: Payer: Self-pay | Admitting: Oncology

## 2021-03-12 ENCOUNTER — Other Ambulatory Visit: Payer: Self-pay | Admitting: Oncology

## 2021-03-31 ENCOUNTER — Ambulatory Visit: Payer: Medicaid Other | Admitting: Oncology

## 2021-03-31 ENCOUNTER — Other Ambulatory Visit: Payer: Self-pay | Admitting: *Deleted

## 2021-03-31 ENCOUNTER — Other Ambulatory Visit: Payer: Medicaid Other

## 2021-03-31 DIAGNOSIS — Z170 Estrogen receptor positive status [ER+]: Secondary | ICD-10-CM

## 2021-03-31 DIAGNOSIS — C50412 Malignant neoplasm of upper-outer quadrant of left female breast: Secondary | ICD-10-CM

## 2021-04-07 ENCOUNTER — Other Ambulatory Visit: Payer: Self-pay

## 2021-04-07 ENCOUNTER — Inpatient Hospital Stay (HOSPITAL_BASED_OUTPATIENT_CLINIC_OR_DEPARTMENT_OTHER): Payer: Medicaid Other | Admitting: Oncology

## 2021-04-07 ENCOUNTER — Other Ambulatory Visit: Payer: Medicaid Other

## 2021-04-07 ENCOUNTER — Encounter: Payer: Self-pay | Admitting: Oncology

## 2021-04-07 ENCOUNTER — Inpatient Hospital Stay: Payer: Medicaid Other | Attending: Oncology

## 2021-04-07 VITALS — BP 159/100 | HR 85 | Temp 98.7°F | Resp 20 | Wt 142.5 lb

## 2021-04-07 DIAGNOSIS — Z79811 Long term (current) use of aromatase inhibitors: Secondary | ICD-10-CM

## 2021-04-07 DIAGNOSIS — M8580 Other specified disorders of bone density and structure, unspecified site: Secondary | ICD-10-CM | POA: Insufficient documentation

## 2021-04-07 DIAGNOSIS — D751 Secondary polycythemia: Secondary | ICD-10-CM

## 2021-04-07 DIAGNOSIS — C50912 Malignant neoplasm of unspecified site of left female breast: Secondary | ICD-10-CM | POA: Diagnosis not present

## 2021-04-07 DIAGNOSIS — C50412 Malignant neoplasm of upper-outer quadrant of left female breast: Secondary | ICD-10-CM | POA: Diagnosis present

## 2021-04-07 DIAGNOSIS — Z170 Estrogen receptor positive status [ER+]: Secondary | ICD-10-CM

## 2021-04-07 LAB — COMPREHENSIVE METABOLIC PANEL
ALT: 19 U/L (ref 0–44)
AST: 30 U/L (ref 15–41)
Albumin: 4.1 g/dL (ref 3.5–5.0)
Alkaline Phosphatase: 76 U/L (ref 38–126)
Anion gap: 9 (ref 5–15)
BUN: 14 mg/dL (ref 8–23)
CO2: 25 mmol/L (ref 22–32)
Calcium: 9.4 mg/dL (ref 8.9–10.3)
Chloride: 103 mmol/L (ref 98–111)
Creatinine, Ser: 0.8 mg/dL (ref 0.44–1.00)
GFR, Estimated: >60 mL/min (ref >60)
Glucose, Bld: 98 mg/dL (ref 70–99)
Potassium: 3.4 mmol/L — ABNORMAL LOW (ref 3.5–5.1)
Sodium: 137 mmol/L (ref 135–145)
Total Bilirubin: 0.4 mg/dL (ref 0.3–1.2)
Total Protein: 7.4 g/dL (ref 6.5–8.1)

## 2021-04-07 LAB — CBC WITH DIFFERENTIAL/PLATELET
Abs Immature Granulocytes: 0.03 10*3/uL (ref 0.00–0.07)
Basophils Absolute: 0 10*3/uL (ref 0.0–0.1)
Basophils Relative: 1 %
Eosinophils Absolute: 0.2 10*3/uL (ref 0.0–0.5)
Eosinophils Relative: 3 %
HCT: 49 % — ABNORMAL HIGH (ref 36.0–46.0)
Hemoglobin: 16.7 g/dL — ABNORMAL HIGH (ref 12.0–15.0)
Immature Granulocytes: 1 %
Lymphocytes Relative: 22 %
Lymphs Abs: 1.3 10*3/uL (ref 0.7–4.0)
MCH: 32.8 pg (ref 26.0–34.0)
MCHC: 34.1 g/dL (ref 30.0–36.0)
MCV: 96.3 fL (ref 80.0–100.0)
Monocytes Absolute: 0.7 10*3/uL (ref 0.1–1.0)
Monocytes Relative: 11 %
Neutro Abs: 3.9 10*3/uL (ref 1.7–7.7)
Neutrophils Relative %: 62 %
Platelets: 341 10*3/uL (ref 150–400)
RBC: 5.09 MIL/uL (ref 3.87–5.11)
RDW: 12.3 % (ref 11.5–15.5)
WBC: 6.1 10*3/uL (ref 4.0–10.5)
nRBC: 0 % (ref 0.0–0.2)

## 2021-04-07 NOTE — Progress Notes (Signed)
?Hematology/Oncology note ?[place] ?[phone] [phone] ? ? ?Patient Care Team: ?Center, [facility] as PCP - General (General Practice) ?Ronny Bacon, MD as Consulting Physician (General Surgery) ?Earlie Server, MD as Consulting Physician (Oncology) ?Theodore Demark, RN as Oncology Nurse Navigator ?Noreene Filbert, MD as Referring Physician (Radiation Oncology) ? ?REFERRING PROVIDER: ?Center, [place] COMPLAINTS/REASON FOR VISIT:  ? Follow-up for breast cancer ? ?HISTORY OF PRESENTING ILLNESS:  ? ?Cynthia Nelson is a  66 y.o.  female with PMH listed below was seen in consultation at the request of  Center, Jenny Reichmann*  for evaluation of breast cancer ? ?She self palpated left breast mass 2 months.  Denies any nipple discharge, breast skin changes or breast pain. ?05/29/2019 bilateral diagnostic mammogram showed a highly suspicious 1.5 cm mass involving the upper outer quadrant of the left breast, associated with architectural distortion.  Accounting for the palpable concern. ?Solitary abnormal left axillary lymph node with cortical thickening up to 9 mm.  Recent COVID-19 vaccination in the left arm about 4 weeks prior to the examination. ? ?Patient underwent biopsy of the left breast mass and left axillary lymph node. ?Left breast mass biopsy showed invasive lobular carcinoma, classic type, atypical lobular hyperplasia. ?Grade 2, lymph node biopsy was negative for metastatic carcinoma. ?ER 90% PR 1 to 10%, HER-2 equivocal by IHC.  HER-2 FISH is pending. ? ?Menarche age of 54, ?Patient has a history of birth control  Use for less than a year. ?She is single, no children.  No previous pregnancy. ?Partial hysterectomy due to fibroid disease in 2005. ?Previous breast biopsy 2003 FNA right breast, cyst aspiration. ?Denies any chest radiation. ? ?Family history of cancer, ?She reports a family history significant for sister deceased from  ovarian cancer at age of 20, brother had kidney cancer.  Mother who was a smoker deceased from lung cancer. ?Grandfather and grand mother had history of cancer.  Details not available. ?Genetic testing was done which showed HOXB13 and POLE VUS, no pathogenic mutation. ? ?07/31/2019, underwent left lumpectomy with sentinel lymph node biopsy. 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  Margins were negative for both invasive carcinoma and DCIS ?pT1c pN0 ?September 2021, patient finished adjuvant radiation. ? ?10/25/2019, patient started on Arimidex 1 mg daily. ? ?INTERVAL HISTORY ?[name] a 66y.o. female who has above history reviewed by me today presents for follow up visit for management of breast cancer ?Patient reports tolerating Arimidex well.  Manageable side effects. ?She denies any new concerns today.  Denies any breast concerns. ? ?Review of Systems  ?Constitutional:  Negative for appetite change, chills, fatigue and fever.  ?HENT:   Negative for hearing loss and voice change.   ?Eyes:  Negative for eye problems.  ?Respiratory:  Negative for chest tightness and cough.   ?Cardiovascular:  Negative for chest pain.  ?Gastrointestinal:  Negative for abdominal distention, abdominal pain, blood in stool and nausea.  ?Endocrine: Negative for hot flashes.  ?Genitourinary:  Negative for difficulty urinating and frequency.   ?Musculoskeletal:  Positive for arthralgias.  ?Skin:  Negative for itching and rash.  ?Neurological:  Negative for extremity weakness.  ?Hematological:  Negative for adenopathy.  ?Psychiatric/Behavioral:  Negative for confusion.   ? ?MEDICAL HISTORY:  ?Past Medical History:  ?Diagnosis Date  ? Cancer ([place] 07/31/2019  ?  Breast CA lumpectomy, radiation 09/13/19-10/12/19  ? Family history of kidney cancer   ? Family history of  melanoma   ? Family history of ovarian cancer   ? Family history of prostate cancer   ? Hyperlipidemia   ? Hypertension 07/25/2019  ? Pt states hx white coat. BP only elevated at MD office.  ? Psoriasis   ? ? ?SURGICAL HISTORY: ?Past Surgical History:  ?Procedure Laterality Date  ? ABDOMINAL HYSTERECTOMY    ? partial- still has ovaries  ? BREAST BIOPSY Left 06/01/2019  ? [place] bx mass at 1:00, venus marker, path pending  ? BREAST BIOPSY Left 06/01/2019  ? [place] bx of LN, coil marker, path pending  ? BREAST CYST ASPIRATION Right   ? BREAST LUMPECTOMY Left 07/2019  ? [facility] lumpectomy with rad no chemo  ? BREAST LUMPECTOMY,RADIO FREQ LOCALIZER,AXILLARY SENTINEL LYMPH NODE BIOPSY Left 07/31/2019  ? Procedure: BREAST LUMPECTOMY,RADIO FREQ LOCALIZER,AXILLARY SENTINEL LYMPH NODE BIOPSY;  Surgeon: Ronny Bacon, MD;  Location: [facility] ORS;  Service: General;  Laterality: Left;  ? TONSILLECTOMY AND ADENOIDECTOMY    ? ? ?SOCIAL HISTORY: ?Social History  ? ?Socioeconomic History  ? Marital status: Single  ?  Spouse name: Not on file  ? Number of children: Not on file  ? Years of education: Not on file  ? Highest education level: Not on file  ?Occupational History  ? Occupation: [employer]   ?Tobacco Use  ? Smoking status: Never  ? Smokeless tobacco: Never  ?Vaping Use  ? Vaping Use: Never used  ?Substance and Sexual Activity  ? Alcohol use: Never  ? Drug use: Never  ? Sexual activity: Not on file  ?Other Topics Concern  ? Not on file  ?Social History Narrative  ? Not on file  ? ?Social Determinants of Health  ? ?Financial Resource Strain: Not on file  ?Food Insecurity: Not on file  ?Transportation Needs: Not on file  ?Physical Activity: Not on file  ?Stress: Not on file  ?Social Connections: Not on file  ?Intimate Partner Violence: Not on file  ? ? ?FAMILY HISTORY: ?Family History  ?Problem Relation Age of Onset  ? Lung cancer Mother 40  ?     smoker  ? Melanoma Mother 41  ? Ovarian cancer Sister 73  ? Emphysema Father   ?  COPD Father   ? Prostate cancer Father   ? Kidney cancer Brother 12  ? Cancer Maternal Grandmother   ?     possibly due to [place]  ? Cancer Maternal Grandfather   ?     possibly due to tannery   ? Heart disease Paternal Grandmother   ? Hypertension Paternal Grandmother   ? Heart disease Brother   ? Heart Problems Maternal Uncle   ? Breast cancer Neg Hx   ? ? ?ALLERGIES:  is allergic to codeine. ? ?MEDICATIONS:  ?Current Outpatient Medications  ?Medication Sig Dispense Refill  ? anastrozole (ARIMIDEX) 1 MG tablet TAKE 1 TABLET BY MOUTH EVERY DAY 90 tablet 0  ? Apremilast (OTEZLA) 30 MG TABS Take 1 tablet (30 mg total) by mouth 2 (two) times daily. 60 tablet 6  ? atorvastatin (LIPITOR) 20 MG tablet TAKE 1 TABLET BY MOUTH EVERY DAY 90 tablet 0  ? acetaminophen (TYLENOL) 500 MG tablet Take 500 mg by mouth daily as needed for moderate pain or headache. (Patient not taking: Reported on 09/30/2020)    ? Chlorpheniramine-DM (CORICIDIN HBP COUGH/COLD PO) Take 1 tablet by mouth daily as  needed (congestion). (Patient not taking: Reported on 06/06/2020)    ? ?No current facility-administered medications for this visit.  ? ? ? ?PHYSICAL EXAMINATION: ?ECOG PERFORMANCE STATUS: 1 - Symptomatic but completely ambulatory ?Vitals:  ? 04/07/21 0941  ?BP: (!) 159/100  ?Pulse: 85  ?Resp: 20  ?Temp: 98.7 ?F (37.1 ?C)  ?SpO2: 100%  ? ?Filed Weights  ? 04/07/21 0941  ?Weight: 142 lb 8 oz (64.6 kg)  ? ? ?Physical Exam ?Constitutional:   ?   General: She is not in acute distress. ?HENT:  ?   Head: Normocephalic and atraumatic.  ?Eyes:  ?   General: No scleral icterus. ?Cardiovascular:  ?   Rate and Rhythm: Normal rate and regular rhythm.  ?   Heart sounds: Normal heart sounds.  ?Pulmonary:  ?   Effort: Pulmonary effort is normal. No respiratory distress.  ?   Breath sounds: No wheezing.  ?Abdominal:  ?   General: Bowel sounds are normal. There is no distension.  ?   Palpations: Abdomen is soft.  ?Musculoskeletal:     ?   General: No  deformity. Normal range of motion.  ?   Cervical back: Normal range of motion and neck supple.  ?Skin: ?   General: Skin is warm and dry.  ?   Findings: No erythema or rash.  ?Neurological:  ?   Mental Status: She

## 2021-04-09 ENCOUNTER — Ambulatory Visit: Payer: Medicaid Other

## 2021-04-17 ENCOUNTER — Inpatient Hospital Stay: Payer: Medicaid Other

## 2021-04-17 VITALS — BP 147/91 | HR 75 | Temp 98.8°F | Resp 20

## 2021-04-17 DIAGNOSIS — C50412 Malignant neoplasm of upper-outer quadrant of left female breast: Secondary | ICD-10-CM

## 2021-04-17 DIAGNOSIS — Z79811 Long term (current) use of aromatase inhibitors: Secondary | ICD-10-CM

## 2021-04-17 DIAGNOSIS — M8580 Other specified disorders of bone density and structure, unspecified site: Secondary | ICD-10-CM

## 2021-04-17 LAB — BASIC METABOLIC PANEL
Anion gap: 9 (ref 5–15)
BUN: 15 mg/dL (ref 8–23)
CO2: 26 mmol/L (ref 22–32)
Calcium: 9.5 mg/dL (ref 8.9–10.3)
Chloride: 103 mmol/L (ref 98–111)
Creatinine, Ser: 0.74 mg/dL (ref 0.44–1.00)
GFR, Estimated: >60 mL/min (ref >60)
Glucose, Bld: 92 mg/dL (ref 70–99)
Potassium: 3.8 mmol/L (ref 3.5–5.1)
Sodium: 138 mmol/L (ref 135–145)

## 2021-04-17 MED ORDER — ZOLEDRONIC ACID 4 MG/100ML IV SOLN
4.0000 mg | Freq: Once | INTRAVENOUS | Status: AC
  Administered 2021-04-17: 4 mg via INTRAVENOUS
  Filled 2021-04-17: qty 100

## 2021-04-17 MED ORDER — SODIUM CHLORIDE 0.9 % IV SOLN
Freq: Once | INTRAVENOUS | Status: AC
  Filled 2021-04-17: qty 250

## 2021-04-17 NOTE — Patient Instructions (Signed)

## 2021-05-26 ENCOUNTER — Other Ambulatory Visit: Payer: Self-pay

## 2021-05-26 DIAGNOSIS — L409 Psoriasis, unspecified: Secondary | ICD-10-CM

## 2021-05-26 MED ORDER — OTEZLA 30 MG PO TABS: 30.0000 mg | ORAL_TABLET | Freq: Two times a day (BID) | ORAL | 0 refills | Status: DC

## 2021-05-26 NOTE — Progress Notes (Signed)
1 RF of Otezla to get patient to next follow up appointment. aw ?

## 2021-05-29 ENCOUNTER — Other Ambulatory Visit: Payer: Self-pay

## 2021-05-29 ENCOUNTER — Ambulatory Visit: Payer: Medicaid Other | Admitting: Dermatology

## 2021-05-29 DIAGNOSIS — L409 Psoriasis, unspecified: Secondary | ICD-10-CM

## 2021-05-29 MED ORDER — OTEZLA 30 MG PO TABS: 30.0000 mg | ORAL_TABLET | Freq: Two times a day (BID) | ORAL | 0 refills | Status: DC

## 2021-06-07 ENCOUNTER — Other Ambulatory Visit: Payer: Self-pay | Admitting: Oncology

## 2021-06-12 ENCOUNTER — Ambulatory Visit
Admission: RE | Admit: 2021-06-12 | Discharge: 2021-06-12 | Disposition: A | Discharge status: Home / Self Care | Payer: Medicaid Other | Source: Ambulatory Visit | Attending: Oncology | Admitting: Oncology

## 2021-06-12 DIAGNOSIS — C50412 Malignant neoplasm of upper-outer quadrant of left female breast: Secondary | ICD-10-CM

## 2021-06-12 DIAGNOSIS — Z170 Estrogen receptor positive status [ER+]: Secondary | ICD-10-CM | POA: Insufficient documentation

## 2021-06-18 ENCOUNTER — Ambulatory Visit: Payer: Medicaid Other | Admitting: Dermatology

## 2021-06-23 ENCOUNTER — Other Ambulatory Visit: Payer: Self-pay

## 2021-06-23 DIAGNOSIS — L409 Psoriasis, unspecified: Secondary | ICD-10-CM

## 2021-06-23 MED ORDER — OTEZLA 30 MG PO TABS: 30.0000 mg | ORAL_TABLET | Freq: Two times a day (BID) | ORAL | 0 refills | Status: DC

## 2021-06-23 NOTE — Progress Notes (Signed)
Senderra faxed over refill request for [facility]. 1 refill sent in to cover her till her next apt.

## 2021-07-10 ENCOUNTER — Ambulatory Visit (INDEPENDENT_AMBULATORY_CARE_PROVIDER_SITE_OTHER): Payer: Medicaid Other | Admitting: Dermatology

## 2021-07-10 DIAGNOSIS — Z79899 Other long term (current) drug therapy: Secondary | ICD-10-CM

## 2021-07-10 DIAGNOSIS — L409 Psoriasis, unspecified: Secondary | ICD-10-CM | POA: Diagnosis not present

## 2021-07-10 DIAGNOSIS — L299 Pruritus, unspecified: Secondary | ICD-10-CM | POA: Diagnosis not present

## 2021-07-10 NOTE — Progress Notes (Signed)
   Follow-Up Visit   Subjective  Cynthia Nelson is a 66 y.o. female who presents for the following: Psoriasis (7 month follow up - post scalp is a little itchy but otherwise she is doing good. Otezla 30 mg bid - no headaches, depression or diarrhea).  The following portions of the chart were reviewed this encounter and updated as appropriate:   Tobacco  Allergies  Meds  Problems  Med Hx  Surg Hx  Fam Hx     Review of Systems:  No other skin or systemic complaints except as noted in HPI or Assessment and Plan.  Objective  Well appearing patient in no apparent distress; mood and affect are within normal limits.  A focused examination was performed including scalp, hands, legs. Relevant physical exam findings are noted in the Assessment and Plan.  Mild peeling of left palm otherwise clear.  BSA - 1%  Scalp Clear today   Assessment & Plan  Pruritus - 2ndary to Urticaria? Scalp Possibly related to urticaria - Recommend Claritin or Allegra 1-4 po qd  Psoriasis Psoriasis is a chronic non-curable, but treatable genetic/hereditary disease that may have other systemic features affecting other organ systems such as joints (Psoriatic Arthritis). It is associated with an increased risk of inflammatory bowel disease, heart disease, non-alcoholic fatty liver disease, and depression.   Chronic and persistent condition with duration or expected duration over one year. Condition is symptomatic / bothersome to patient. Not to goal but much improved.  Continue Otezla 30 mg 1 po bid No side effects to medication.  Patient pleased with results.  Long term medication management.  Patient is using long term (months to years) prescription medication  to control their dermatologic condition.  These medications require periodic monitoring to evaluate for efficacy and side effects and may require periodic laboratory monitoring.  Related Medications Apremilast (OTEZLA) 30 MG TABS Take 1 tablet (30 mg  total) by mouth 2 (two) times daily.  Return in about 6 months (around 01/09/2022) for Psoriasis.  I, Ashok Cordia, CMA, am acting as scribe for Sarina Ser, MD . Documentation: I have reviewed the above documentation for accuracy and completeness, and I agree with the above.  Sarina Ser, MD

## 2021-07-10 NOTE — Patient Instructions (Signed)
Due to recent changes in healthcare laws, you may see results of your pathology and/or laboratory studies on MyChart before the doctors have had a chance to review them. We understand that in some cases there may be results that are confusing or concerning to you. Please understand that not all results are received at the same time and often the doctors may need to interpret multiple results in order to provide you with the best plan of care or course of treatment. Therefore, we ask that you please give us 2 business days to thoroughly review all your results before contacting the office for clarification. Should we see a critical lab result, you will be contacted sooner.   If You Need Anything After Your Visit  If you have any questions or concerns for your doctor, please call our main line at [phone] and press option 4 to reach your doctor's medical assistant. If no one answers, please leave a voicemail as directed and we will return your call as soon as possible. Messages left after 4 pm will be answered the following business day.   You may also send us a message via MyChart. We typically respond to MyChart messages within 1-2 business days.  For prescription refills, please ask your pharmacy to contact our office. Our fax number is [phone].  If you have an urgent issue when the clinic is closed that cannot wait until the next business day, you can page your doctor at the number below.    Please note that while we do our best to be available for urgent issues outside of office hours, we are not available 24/7.   If you have an urgent issue and are unable to reach us, you may choose to seek medical care at your doctor's office, retail clinic, urgent care center, or emergency room.  If you have a medical emergency, please immediately call 911 or go to the emergency department.  Pager Numbers  - Dr. Kowalski: [phone]  - Dr. Moye: [phone]  - Dr. Stewart:  [phone]  In the event of inclement weather, please call our main line at [phone] for an update on the status of any delays or closures.  Dermatology Medication Tips: Please keep the boxes that topical medications come in in order to help keep track of the instructions about where and how to use these. Pharmacies typically print the medication instructions only on the boxes and not directly on the medication tubes.   If your medication is too expensive, please contact our office at [phone] option 4 or send us a message through MyChart.   We are unable to tell what your co-pay for medications will be in advance as this is different depending on your insurance coverage. However, we may be able to find a substitute medication at lower cost or fill out paperwork to get insurance to cover a needed medication.   If a prior authorization is required to get your medication covered by your insurance company, please allow us 1-2 business days to complete this process.  Drug prices often vary depending on where the prescription is filled and some pharmacies may offer cheaper prices.  The website www.goodrx.com contains coupons for medications through different pharmacies. The prices here do not account for what the cost may be with help from insurance (it may be cheaper with your insurance), but the website can give you the price if you did not use any insurance.  - You can print the associated coupon and take it with   your prescription to the pharmacy.  - You may also stop by our office during regular business hours and pick up a GoodRx coupon card.  - If you need your prescription sent electronically to a different pharmacy, notify our office through [facility] MyChart or by phone at [phone] option 4.     Si Usted Necesita Algo Despus de Su Visita  Tambin puede enviarnos un mensaje a travs de MyChart. Por lo general respondemos a los mensajes de MyChart en el transcurso de 1 a 2  das hbiles.  Para renovar recetas, por favor pida a su farmacia que se ponga en contacto con nuestra oficina. Nuestro nmero de fax es el [phone].  Si tiene un asunto urgente cuando la clnica est cerrada y que no puede esperar hasta el siguiente da hbil, puede llamar/localizar a su doctor(a) al nmero que aparece a continuacin.   Por favor, tenga en cuenta que aunque hacemos todo lo posible para estar disponibles para asuntos urgentes fuera del horario de oficina, no estamos disponibles las 24 horas del da, los 7 das de la semana.   Si tiene un problema urgente y no puede comunicarse con nosotros, puede optar por buscar atencin mdica  en el consultorio de su doctor(a), en una clnica privada, en un centro de atencin urgente o en una sala de emergencias.  Si tiene una emergencia mdica, por favor llame inmediatamente al 911 o vaya a la sala de emergencias.  Nmeros de bper  - Dr. Kowalski: [phone]  - Dra. Moye: [phone]  - Dra. Stewart: [phone]  En caso de inclemencias del tiempo, por favor llame a nuestra lnea principal al [phone] para una actualizacin sobre el estado de cualquier retraso o cierre.  Consejos para la medicacin en dermatologa: Por favor, guarde las cajas en las que vienen los medicamentos de uso tpico para ayudarle a seguir las instrucciones sobre dnde y cmo usarlos. Las farmacias generalmente imprimen las instrucciones del medicamento slo en las cajas y no directamente en los tubos del medicamento.   Si su medicamento es muy caro, por favor, pngase en contacto con nuestra oficina llamando al [phone] y presione la opcin 4 o envenos un mensaje a travs de MyChart.   No podemos decirle cul ser su copago por los medicamentos por adelantado ya que esto es diferente dependiendo de la cobertura de su seguro. Sin embargo, es posible que podamos encontrar un medicamento sustituto a menor costo o llenar un formulario para que el  seguro cubra el medicamento que se considera necesario.   Si se requiere una autorizacin previa para que su compaa de seguros cubra su medicamento, por favor permtanos de 1 a 2 das hbiles para completar este proceso.  Los precios de los medicamentos varan con frecuencia dependiendo del lugar de dnde se surte la receta y alguna farmacias pueden ofrecer precios ms baratos.  El sitio web www.goodrx.com tiene cupones para medicamentos de diferentes farmacias. Los precios aqu no tienen en cuenta lo que podra costar con la ayuda del seguro (puede ser ms barato con su seguro), pero el sitio web puede darle el precio si no utiliz ningn seguro.  - Puede imprimir el cupn correspondiente y llevarlo con su receta a la farmacia.  - Tambin puede pasar por nuestra oficina durante el horario de atencin regular y recoger una tarjeta de cupones de GoodRx.  - Si necesita que su receta se enve electrnicamente a una farmacia diferente, informe a nuestra oficina a travs de MyChart de [place]   o por telfono llamando al [phone] y presione la opcin 4.  

## 2021-07-14 ENCOUNTER — Other Ambulatory Visit: Payer: Self-pay

## 2021-07-14 DIAGNOSIS — L409 Psoriasis, unspecified: Secondary | ICD-10-CM

## 2021-07-14 MED ORDER — OTEZLA 30 MG PO TABS: 30.0000 mg | ORAL_TABLET | Freq: Two times a day (BID) | ORAL | 1 refills | Status: DC

## 2021-07-29 ENCOUNTER — Encounter: Payer: Self-pay | Admitting: Dermatology

## 2021-08-14 IMAGING — MG MM BREAST LOCALIZATION CLIP
8 series · 8 of 24 positions shown · non-contrast
Comparison: Previous exam(s).

CLINICAL DATA: Status post ultrasound-guided core needle biopsy a
1.5 cm mass in the 1 o'clock position of the left breast and
ultrasound-guided core needle biopsy of an abnormal left axillary
node.

EXAM:
DIAGNOSTIC LEFT MAMMOGRAM POST ULTRASOUND BIOPSY X 2

[L CC synth-2D]
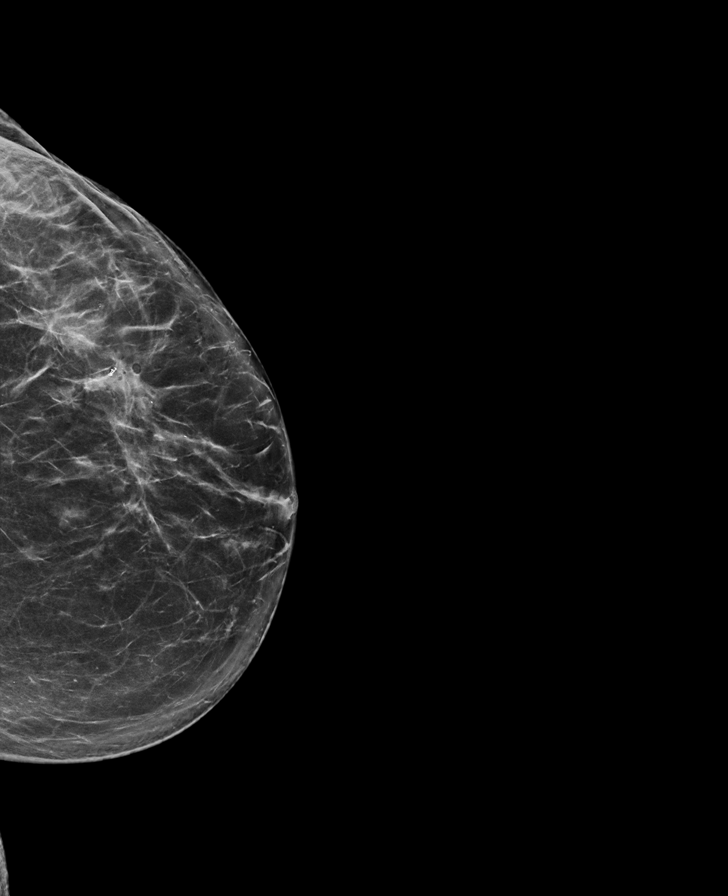

[L MLO synth-2D]
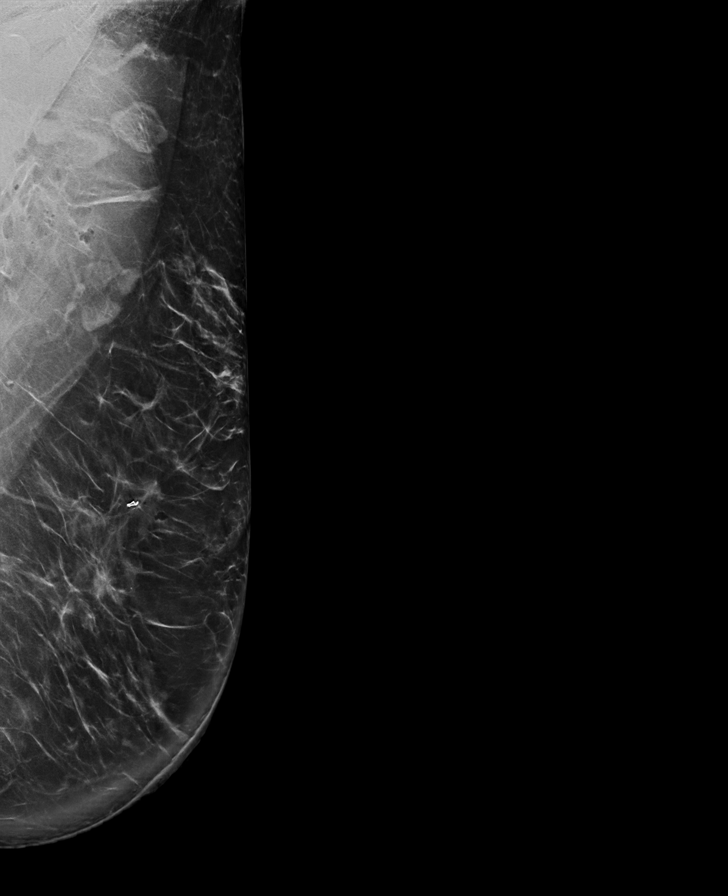

[L ML synth-2D]
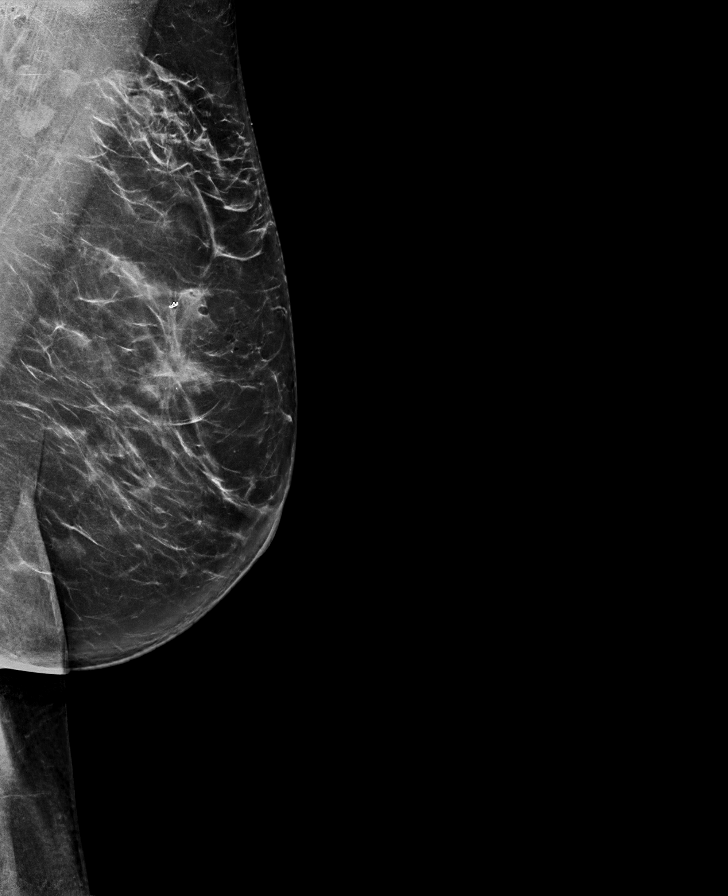

[L AT synth-2D]
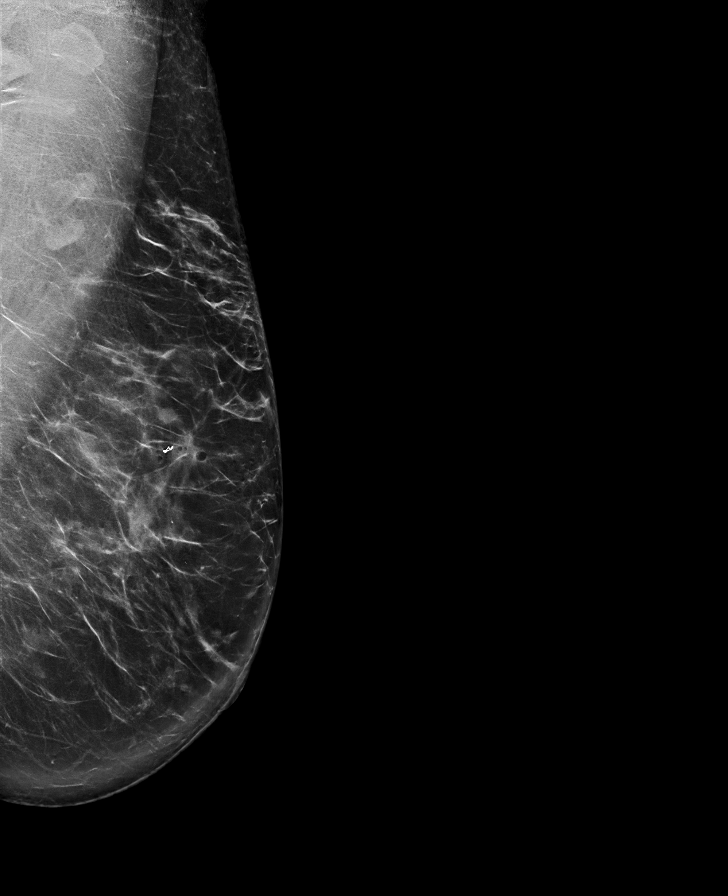

[L CC tomo · tomo slice 40/79.0]
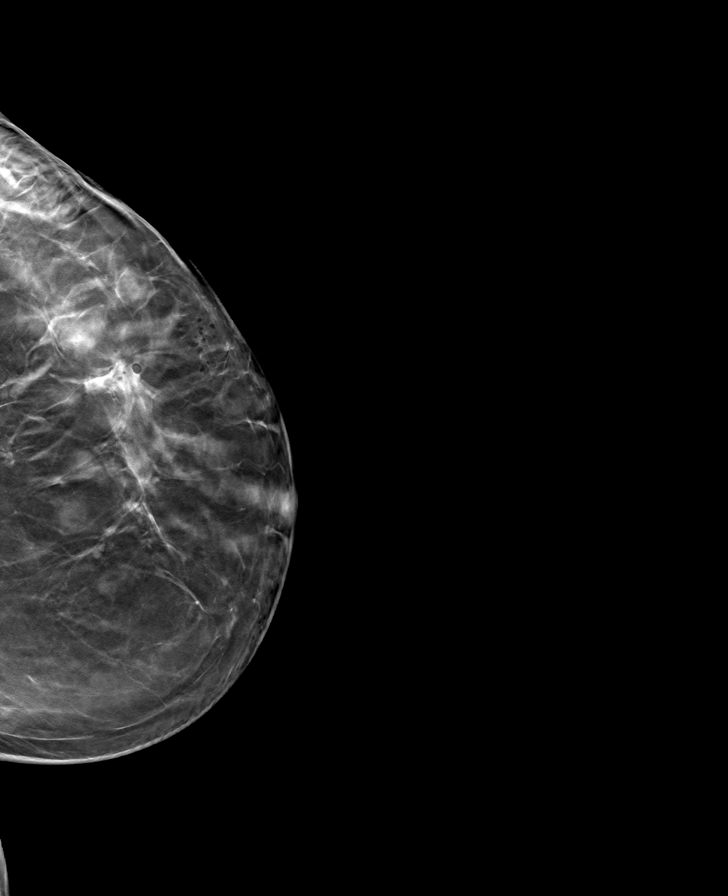

[L MLO tomo · tomo slice 49/97.0]
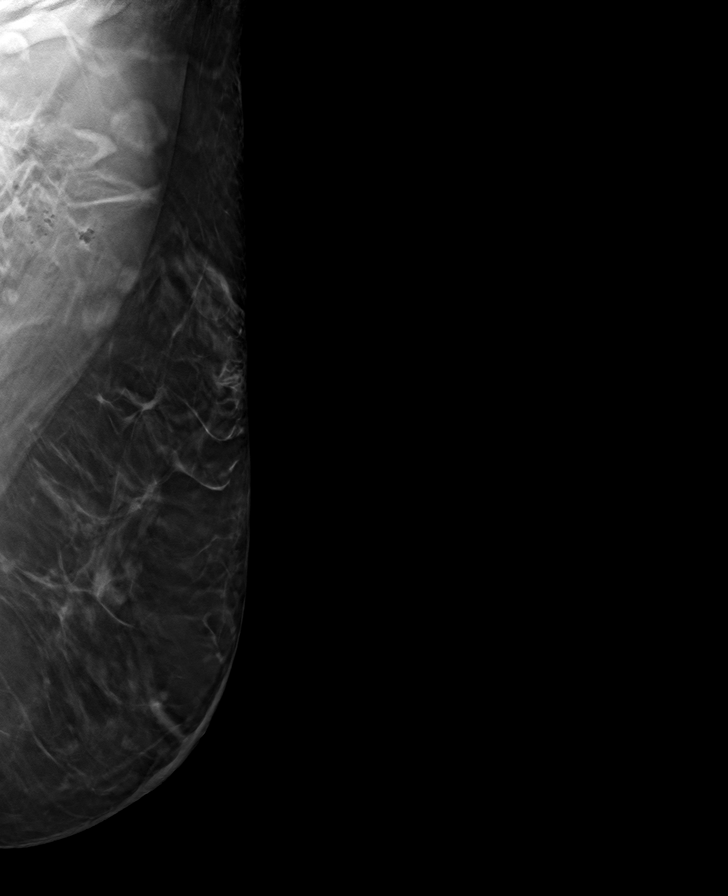

[L ML tomo · tomo slice 45/88.0]
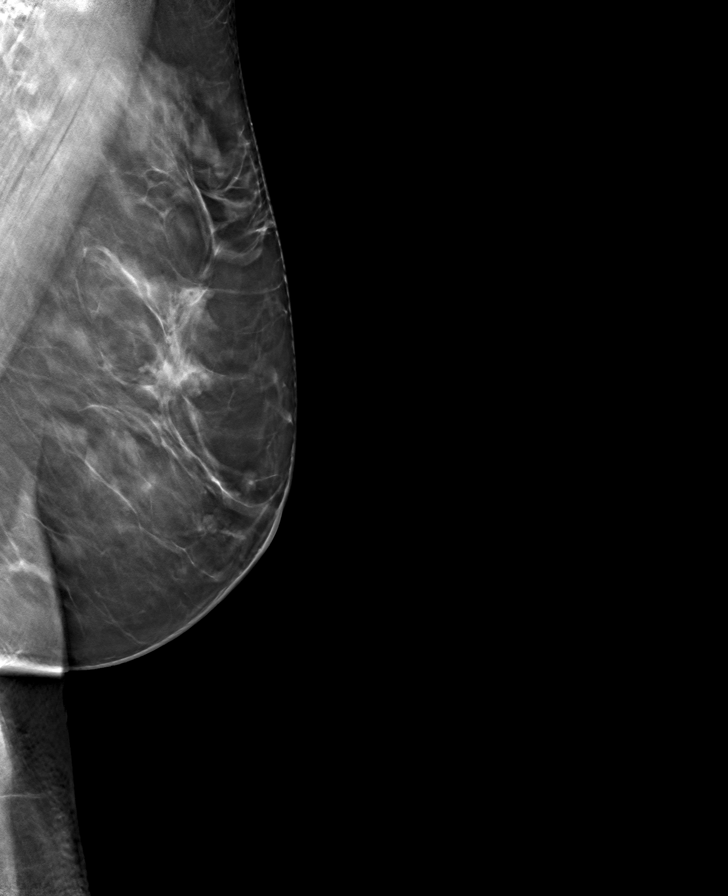

[L AT tomo · tomo slice 45/90.0]
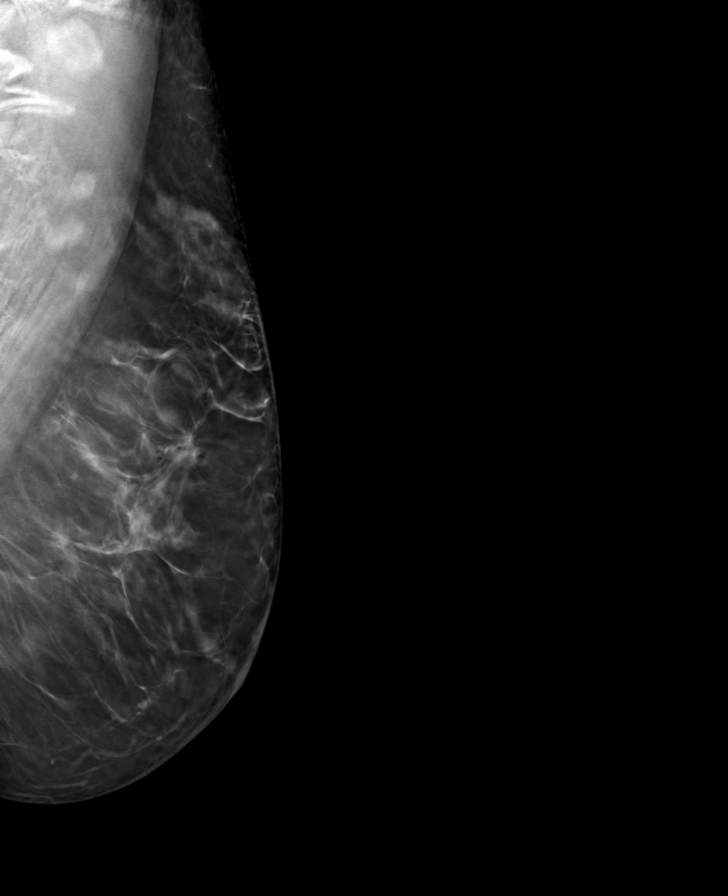

[8 of 24 positions shown; findings below may reference images not displayed]

FINDINGS: Mammographic images were obtained following ultrasound guided biopsy
of the recently demonstrated 1.5 cm mass in the 1 o'clock position
of the left breast and the recently demonstrated abnormal left
axillary lymph node. The Es shaped biopsy marking clip is in the
biopsied mass in the 1 o'clock position of the left breast. The coil
shaped biopsy marker clip in the biopsied left axillary lymph node
could not be included due to its deep location in the axilla. This
was well seen at ultrasound in the lymph node following clip
placement.
IMPRESSION: Appropriate positioning of the Es shaped biopsy marking clip at
the site of biopsy in the biopsied mass in the 1 o'clock position of
the left breast.

Final Assessment: Post Procedure Mammograms for Marker Placement

## 2021-09-01 ENCOUNTER — Other Ambulatory Visit: Payer: Self-pay

## 2021-09-01 DIAGNOSIS — L409 Psoriasis, unspecified: Secondary | ICD-10-CM

## 2021-09-01 MED ORDER — OTEZLA 30 MG PO TABS: 30.0000 mg | ORAL_TABLET | Freq: Two times a day (BID) | ORAL | 4 refills | Status: DC

## 2021-09-01 NOTE — Progress Notes (Signed)
Refill request faxed over from [facility]. Escripted

## 2021-09-11 ENCOUNTER — Other Ambulatory Visit: Payer: Self-pay | Admitting: Oncology

## 2021-10-16 ENCOUNTER — Inpatient Hospital Stay: Payer: Medicaid Other | Attending: Oncology

## 2021-10-16 ENCOUNTER — Inpatient Hospital Stay: Payer: Medicaid Other

## 2021-10-16 ENCOUNTER — Encounter: Payer: Self-pay | Admitting: Oncology

## 2021-10-16 ENCOUNTER — Inpatient Hospital Stay (HOSPITAL_BASED_OUTPATIENT_CLINIC_OR_DEPARTMENT_OTHER): Payer: Medicaid Other | Admitting: Oncology

## 2021-10-16 VITALS — BP 155/88 | HR 73 | Temp 97.8°F | Resp 19 | Wt 144.3 lb

## 2021-10-16 DIAGNOSIS — Z8051 Family history of malignant neoplasm of kidney: Secondary | ICD-10-CM | POA: Diagnosis not present

## 2021-10-16 DIAGNOSIS — D351 Benign neoplasm of parathyroid gland: Secondary | ICD-10-CM | POA: Diagnosis not present

## 2021-10-16 DIAGNOSIS — Z7983 Long term (current) use of bisphosphonates: Secondary | ICD-10-CM | POA: Diagnosis not present

## 2021-10-16 DIAGNOSIS — M85851 Other specified disorders of bone density and structure, right thigh: Secondary | ICD-10-CM | POA: Insufficient documentation

## 2021-10-16 DIAGNOSIS — E210 Primary hyperparathyroidism: Secondary | ICD-10-CM | POA: Insufficient documentation

## 2021-10-16 DIAGNOSIS — Z79811 Long term (current) use of aromatase inhibitors: Secondary | ICD-10-CM | POA: Diagnosis not present

## 2021-10-16 DIAGNOSIS — C50412 Malignant neoplasm of upper-outer quadrant of left female breast: Secondary | ICD-10-CM | POA: Diagnosis not present

## 2021-10-16 DIAGNOSIS — Z170 Estrogen receptor positive status [ER+]: Secondary | ICD-10-CM | POA: Insufficient documentation

## 2021-10-16 DIAGNOSIS — D751 Secondary polycythemia: Secondary | ICD-10-CM

## 2021-10-16 DIAGNOSIS — Z923 Personal history of irradiation: Secondary | ICD-10-CM | POA: Diagnosis not present

## 2021-10-16 DIAGNOSIS — M8580 Other specified disorders of bone density and structure, unspecified site: Secondary | ICD-10-CM

## 2021-10-16 DIAGNOSIS — Z801 Family history of malignant neoplasm of trachea, bronchus and lung: Secondary | ICD-10-CM | POA: Diagnosis not present

## 2021-10-16 DIAGNOSIS — Z8041 Family history of malignant neoplasm of ovary: Secondary | ICD-10-CM | POA: Insufficient documentation

## 2021-10-16 DIAGNOSIS — C50912 Malignant neoplasm of unspecified site of left female breast: Secondary | ICD-10-CM

## 2021-10-16 LAB — CBC WITH DIFFERENTIAL/PLATELET
Abs Immature Granulocytes: 0.01 10*3/uL (ref 0.00–0.07)
Basophils Absolute: 0.1 10*3/uL (ref 0.0–0.1)
Basophils Relative: 1 %
Eosinophils Absolute: 0.5 10*3/uL (ref 0.0–0.5)
Eosinophils Relative: 8 %
HCT: 45 % (ref 36.0–46.0)
Hemoglobin: 15.7 g/dL — ABNORMAL HIGH (ref 12.0–15.0)
Immature Granulocytes: 0 %
Lymphocytes Relative: 21 %
Lymphs Abs: 1.4 10*3/uL (ref 0.7–4.0)
MCH: 33.1 pg (ref 26.0–34.0)
MCHC: 34.9 g/dL (ref 30.0–36.0)
MCV: 94.9 fL (ref 80.0–100.0)
Monocytes Absolute: 0.6 10*3/uL (ref 0.1–1.0)
Monocytes Relative: 9 %
Neutro Abs: 4.2 10*3/uL (ref 1.7–7.7)
Neutrophils Relative %: 61 %
Platelets: 256 10*3/uL (ref 150–400)
RBC: 4.74 MIL/uL (ref 3.87–5.11)
RDW: 12.5 % (ref 11.5–15.5)
WBC: 6.9 10*3/uL (ref 4.0–10.5)
nRBC: 0 % (ref 0.0–0.2)

## 2021-10-16 LAB — COMPREHENSIVE METABOLIC PANEL
ALT: 21 U/L (ref 0–44)
AST: 28 U/L (ref 15–41)
Albumin: 4.1 g/dL (ref 3.5–5.0)
Alkaline Phosphatase: 78 U/L (ref 38–126)
Anion gap: 6 (ref 5–15)
BUN: 15 mg/dL (ref 8–23)
CO2: 24 mmol/L (ref 22–32)
Calcium: 9.2 mg/dL (ref 8.9–10.3)
Chloride: 107 mmol/L (ref 98–111)
Creatinine, Ser: 0.7 mg/dL (ref 0.44–1.00)
GFR, Estimated: >60 mL/min (ref >60)
Glucose, Bld: 102 mg/dL — ABNORMAL HIGH (ref 70–99)
Potassium: 3.8 mmol/L (ref 3.5–5.1)
Sodium: 137 mmol/L (ref 135–145)
Total Bilirubin: 0.4 mg/dL (ref 0.3–1.2)
Total Protein: 7.1 g/dL (ref 6.5–8.1)

## 2021-10-16 MED ORDER — ANASTROZOLE 1 MG PO TABS: 1.0000 mg | ORAL_TABLET | Freq: Every day | ORAL | 1 refills | Status: DC

## 2021-10-16 MED ORDER — SODIUM CHLORIDE 0.9 % IV SOLN
Freq: Once | INTRAVENOUS | Status: AC
  Filled 2021-10-16: qty 250

## 2021-10-16 MED ORDER — ZOLEDRONIC ACID 4 MG/100ML IV SOLN
4.0000 mg | Freq: Once | INTRAVENOUS | Status: AC
  Administered 2021-10-16: 4 mg via INTRAVENOUS

## 2021-10-16 NOTE — Assessment & Plan Note (Signed)
secondary to parathyroid adenoma.   S/p left inferior parathyroidectomy. Her hypercalcemia has normalized.

## 2021-10-16 NOTE — Assessment & Plan Note (Signed)
Previous work-up showed negative JAK2 mutation with reflex.  Suspect secondary etiologies.  Recommend sleep study and she will discuss with PCP

## 2021-10-16 NOTE — Assessment & Plan Note (Signed)
11/30/2019 bone density showed osteopenia, right femur neck -1.9 10-year probability of fracture 10.1%, hip fracture 1.3%. Recommend calcium and vitamin D supplementation. She has had dental clearance and is on Zometa every 6 months.

## 2021-10-16 NOTE — Progress Notes (Signed)
Hematology/Oncology note [facility] Telephone:([phone] [phone]   Patient Care Team: [facility] as PCP - General (General Practice) Ronny Bacon, MD as Consulting Physician (General Surgery) Earlie Server, MD as Consulting Physician (Oncology) Theodore Demark, RN (Inactive) as Oncology Nurse Navigator Noreene Filbert, MD as Referring Physician (Radiation Oncology)  ASSESSMENT & PLAN:   Cancer Staging  Malignant neoplasm of upper-outer quadrant of left breast in female, estrogen receptor positive ([place]) Staging form: Breast, AJCC 8th Edition - Pathologic: Stage IA (pT1c, pN0, cM0, G1, ER+, PR+, HER2-, Oncotype DX score: 16) - Signed by Earlie Server, MD on 08/22/2019   Malignant neoplasm of upper-outer quadrant of left breast in female, estrogen receptor positive ([place]) Left breast cancer, stage I pT1c pN0 invasive carcinoma, grade 2, ER+ 90%, PR 1-10% HER-2 equivocal by IHC.  Negative by FISH Oncotype DX recurrence score is 16, no adjuvant chemotherapy.  Status post adjuvant radiation Labs reviewed and discussed with patient. Continue Arimidex 1 mg daily  Continue annual bilateral mammogram Breast cancer tumor markers are chronically elevated, repeat level is pending  Osteopenia 11/30/2019 bone density showed osteopenia, right femur neck -1.9 10-year probability of fracture 10.1%, hip fracture 1.3%. Recommend calcium and vitamin D supplementation. She has had dental clearance and is on Zometa every 6 months.   Primary hyperparathyroidism ([place]) secondary to parathyroid adenoma.   S/p left inferior parathyroidectomy. Her hypercalcemia has normalized.  Erythrocytosis Previous work-up showed negative JAK2 mutation with reflex.  Suspect secondary etiologies.  Recommend sleep study and she will discuss with PCP  Orders Placed This Encounter  Procedures   CBC with Differential/Platelet    Standing Status:   Future    Standing  Expiration Date:   10/17/2022   Comprehensive metabolic panel    Standing Status:   Future    Standing Expiration Date:   10/16/2022   [facility] COMMUNICATION LAB    Lab Appointment 15 Minutes   SCHEDULING COMMUNICATION    Chemotherapy Appointment  - 1 hour    Treatment conditions    Provider to add parameters. zometa every 6 months.  Notify MD if Calcium is less than 8.6    Standing Status:   Standing    Number of Occurrences:   1    Order Specific Question:   Did you provide treatment parameters in the comments section?    Answer:   Yes   Follow up in 6 months. All questions were answered. The patient knows to call the clinic with any problems, questions or concerns.  Earlie Server, MD, PhD [facility] Hematology Oncology 10/16/2021   CHIEF COMPLAINTS/REASON FOR VISIT:   Follow-up for breast cancer  HISTORY OF PRESENTING ILLNESS:   Cynthia Nelson is a  66 y.o.  female with PMH listed below was seen in consultation at the request of  [facility]  for evaluation of breast cancer  She self palpated left breast mass 2 months.  Denies any nipple discharge, breast skin changes or breast pain. 05/29/2019 bilateral diagnostic mammogram showed a highly suspicious 1.5 cm mass involving the upper outer quadrant of the left breast, associated with architectural distortion.  Accounting for the palpable concern. Solitary abnormal left axillary lymph node with cortical thickening up to 9 mm.  Recent COVID-19 vaccination in the left arm about 4 weeks prior to the examination.  Patient underwent biopsy of the left breast mass and left axillary lymph node. Left breast mass biopsy showed invasive lobular carcinoma, classic type,  atypical lobular hyperplasia. Grade 2, lymph node biopsy was negative for metastatic carcinoma. ER 90% PR 1 to 10%, HER-2 equivocal by IHC.  HER-2 FISH is pending.  Menarche age of 59, Patient has a history of birth control  Use for less than a year. She is  single, no children.  No previous pregnancy. Partial hysterectomy due to fibroid disease in 2005. Previous breast biopsy 2003 FNA right breast, cyst aspiration. Denies any chest radiation.  Family history of cancer, She reports a family history significant for sister deceased from ovarian cancer at age of 59, brother had kidney cancer.  Mother who was a smoker deceased from lung cancer. Grandfather and grand mother had history of cancer.  Details not available. Genetic testing was done which showed HOXB13 and POLE VUS, no pathogenic mutation.  07/31/2019, underwent left lumpectomy with sentinel lymph node biopsy. 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  Margins were negative for both invasive carcinoma and DCIS pT1c pN0 September 2021, patient finished adjuvant radiation.  10/25/2019, patient started on Arimidex 1 mg daily.  INTERVAL HISTORY MGerarda Conklinis a 66y.o. female who has above history reviewed by me today presents for follow up visit for management of breast cancer Patient reports tolerating Arimidex well.  Manageable side effects. She denies any new concerns today.  Denies any breast concerns.  Review of Systems  Constitutional:  Negative for appetite change, chills, fatigue and fever.  HENT:   Negative for hearing loss and voice change.   Eyes:  Negative for eye problems.  Respiratory:  Negative for chest tightness and cough.   Cardiovascular:  Negative for chest pain.  Gastrointestinal:  Negative for abdominal distention, abdominal pain, blood in stool and nausea.  Endocrine: Negative for hot flashes.  Genitourinary:  Negative for difficulty urinating and frequency.   Musculoskeletal:  Positive for arthralgias.  Skin:  Negative for itching and rash.  Neurological:  Negative for extremity weakness.   Hematological:  Negative for adenopathy.  Psychiatric/Behavioral:  Negative for confusion.     MEDICAL HISTORY:  Past Medical History:  Diagnosis Date   [facility]   Breast CA lumpectomy, radiation 09/13/19-10/12/19   Family history of kidney cancer    Family history of melanoma    Family history of ovarian cancer    Family history of prostate cancer    Hyperlipidemia    Hypertension 07/25/2019   Pt states hx white coat. BP only elevated at MD office.   Psoriasis     SURGICAL HISTORY: Past Surgical History:  Procedure Laterality Date   ABDOMINAL HYSTERECTOMY     partial- still has ovaries   BREAST BIOPSY Left 06/01/2019   uKoreabx mass at 1:00, venus marker, path pending   BREAST BIOPSY Left 06/01/2019   uKoreabx of LN, coil marker, path pending   BREAST CYST ASPIRATION Right    [facility]   BREAST LUMPECTOMY,RADIO FREQ LOCALIZER,AXILLARY SENTINEL LYMPH NODE BIOPSY Left 07/31/2019   Procedure: BREAST LUMPECTOMY,RADIO FREQ LOCALIZER,AXILLARY SENTINEL LYMPH NODE BIOPSY;  Surgeon: RRonny Bacon MD;  Location: [facility] ORS;  Service: General;  Laterality: Left;   TONSILLECTOMY AND ADENOIDECTOMY      SOCIAL HISTORY: Social History   Socioeconomic History   Marital status: Single    Spouse name: Not on file   Number of children:  Not on file   Years of education: Not on file   Highest education level: Not on file  Occupational History   Occupation: [employer]   Tobacco Use   Smoking status: Never   Smokeless tobacco: Never  Vaping Use   Vaping Use: Never used  Substance and Sexual Activity   Alcohol use: Never   Drug use: Never   Sexual activity: Not on file  Other Topics Concern   Not on file  Social History Narrative   Not on file   Social Determinants of Health   Financial Resource Strain: Not on file  Food Insecurity: Not on file  Transportation Needs: Not on file  Physical Activity:  Not on file  Stress: Not on file  Social Connections: Not on file  Intimate Partner Violence: Not on file    FAMILY HISTORY: Family History  Problem Relation Age of Onset   Lung cancer Mother 40       smoker   Melanoma Mother 27   Ovarian cancer Sister 67   Emphysema Father    COPD Father    Prostate cancer Father    Kidney cancer Brother 47   Cancer Maternal Grandmother        possibly due to [place] Maternal Grandfather        possibly due to tannery    Heart disease Paternal Grandmother    Hypertension Paternal Grandmother    Heart disease Brother    Heart Problems Maternal Uncle    Breast cancer Neg Hx     ALLERGIES:  is allergic to codeine.  MEDICATIONS:  Current Outpatient Medications  Medication Sig Dispense Refill   Apremilast (OTEZLA) 30 MG TABS Take 1 tablet (30 mg total) by mouth 2 (two) times daily. 60 tablet 4   atorvastatin (LIPITOR) 20 MG tablet TAKE 1 TABLET BY MOUTH EVERY DAY 90 tablet 0   acetaminophen (TYLENOL) 500 MG tablet Take 500 mg by mouth daily as needed for moderate pain or headache. (Patient not taking: Reported on 09/30/2020)     anastrozole (ARIMIDEX) 1 MG tablet Take 1 tablet (1 mg total) by mouth daily. 90 tablet 1   Chlorpheniramine-DM (CORICIDIN HBP COUGH/COLD PO) Take 1 tablet by mouth daily as needed (congestion). (Patient not taking: Reported on 06/06/2020)     No current facility-administered medications for this visit.     PHYSICAL EXAMINATION: ECOG PERFORMANCE STATUS: 1 - Symptomatic but completely ambulatory Vitals:   10/16/21 1327  BP: (!) 155/88  Pulse: 73  Resp: 19  Temp: 97.8 F (36.6 C)  SpO2: 98%   Filed Weights   10/16/21 1327  Weight: 144 lb 4.8 oz (65.5 kg)    Physical Exam Constitutional:      General: She is not in acute distress. HENT:     Head: Normocephalic and atraumatic.  Eyes:     General: No scleral icterus. Cardiovascular:     Rate and Rhythm: Normal rate and regular rhythm.      Heart sounds: Normal heart sounds.  Pulmonary:     Effort: Pulmonary effort is normal. No respiratory distress.     Breath sounds: No wheezing.  Abdominal:     General: Bowel sounds are normal. There is no distension.     Palpations: Abdomen is soft.  Musculoskeletal:        General: No deformity. Normal range of motion.     Cervical back: Normal range of motion and neck supple.  Skin:  General: Skin is warm and dry.     Findings: No erythema or rash.  Neurological:     Mental Status: She is alert. Mental status is at baseline.     Cranial Nerves: No cranial nerve deficit.     Coordination: Coordination normal.  Psychiatric:        Mood and Affect: Mood normal.   Breast exam was performed in seated and lying down position.  Left breast s/p lumpectomy, left lower outer quadrant tissue thickening, chronic.  Right breast no palpable mass.  No palpable axillary lymphadenopathy.  LABORATORY DATA:  I have reviewed the data as listed    Latest Ref Rng & Units 10/16/2021    1:17 PM 04/07/2021    9:33 AM 09/30/2020   10:58 AM  CBC  WBC 4.0 - 10.5 K/uL 6.9  6.1  6.2   Hemoglobin 12.0 - 15.0 g/dL 15.7  16.7  16.1   Hematocrit 36.0 - 46.0 % 45.0  49.0  46.6   Platelets 150 - 400 K/uL 256  341  288       Latest Ref Rng & Units 10/16/2021    1:17 PM 04/17/2021   12:49 PM 04/07/2021    9:33 AM  CMP  Glucose 70 - 99 mg/dL 102  92  98   BUN 8 - 23 mg/dL _0 Creatinine 0.44 - 1.00 mg/dL 0.70  0.74  0.80   Sodium 135 - 145 mmol/L 137  138  137   Potassium 3.5 - 5.1 mmol/L 3.8  3.8  3.4   Chloride 98 - 111 mmol/L 107  103  103   CO2 22 - 32 mmol/L _1 Calcium 8.9 - 10.3 mg/dL 9.2  9.5  9.4   Total Protein 6.5 - 8.1 g/dL 7.1   7.4   Total Bilirubin 0.3 - 1.2 mg/dL 0.4   0.4   Alkaline Phos 38 - 126 U/L 78   76   AST 15 - 41 U/L 28   30   ALT 0 - 44 U/L 21   19     RADIOGRAPHIC STUDIES: I have personally reviewed the radiological images as listed and agreed  with the findings in the report. No results found.

## 2021-10-16 NOTE — Assessment & Plan Note (Signed)
Left breast cancer, stage I pT1c pN0 invasive carcinoma, grade 2, ER+ 90%, PR 1-10% HER-2 equivocal by IHC.  Negative by FISH Oncotype DX recurrence score is 16, no adjuvant chemotherapy.  Status post adjuvant radiation Labs reviewed and discussed with patient. Continue Arimidex 1 mg daily  Continue annual bilateral mammogram Breast cancer tumor markers are chronically elevated, repeat level is pending

## 2021-10-17 LAB — CANCER ANTIGEN 27.29: CA 27.29: 45.6 U/mL — ABNORMAL HIGH (ref 0.0–38.6)

## 2021-10-18 LAB — CANCER ANTIGEN 15-3: CA 15-3: 33.5 U/mL — ABNORMAL HIGH (ref 0.0–25.0)

## 2021-10-24 ENCOUNTER — Ambulatory Visit: Payer: Medicaid Other | Admitting: Radiation Oncology

## 2021-11-05 ENCOUNTER — Ambulatory Visit: Payer: Medicaid Other | Admitting: Radiation Oncology

## 2021-11-13 ENCOUNTER — Encounter: Payer: Self-pay | Admitting: Radiation Oncology

## 2021-11-13 ENCOUNTER — Ambulatory Visit
Admission: RE | Admit: 2021-11-13 | Discharge: 2021-11-13 | Disposition: A | Discharge status: Home / Self Care | Payer: Medicaid Other | Source: Ambulatory Visit | Attending: Radiation Oncology | Admitting: Radiation Oncology

## 2021-11-13 VITALS — BP 145/94 | HR 75 | Temp 98.7°F | Ht 61.0 in | Wt 142.5 lb

## 2021-11-13 DIAGNOSIS — Z170 Estrogen receptor positive status [ER+]: Secondary | ICD-10-CM | POA: Insufficient documentation

## 2021-11-13 DIAGNOSIS — Z79811 Long term (current) use of aromatase inhibitors: Secondary | ICD-10-CM | POA: Insufficient documentation

## 2021-11-13 DIAGNOSIS — C50412 Malignant neoplasm of upper-outer quadrant of left female breast: Secondary | ICD-10-CM | POA: Insufficient documentation

## 2021-11-13 DIAGNOSIS — Z923 Personal history of irradiation: Secondary | ICD-10-CM | POA: Diagnosis not present

## 2021-11-13 NOTE — Progress Notes (Signed)
Radiation Oncology Follow up Note  Name: Cynthia Nelson   Date:   11/13/2021 MRN:  [id number] DOB: 1955/01/24    This 66 y.o. 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  REFERRING PROVIDER: [facility], [place]  HPI: Patient is a 66 year old female now at 2 years having completed whole breast radiation to her left breast for stage Ia ER/PR positive invasive mammary carcinoma.  Seen today in routine follow-up she is doing well.  She specifically denies breast tenderness cough.  Or bone pain.  She had mammograms back in May which I have reviewed were BI-RADS 2 benign.  She is currently on Arimidex tolerating it well without side effect.  COMPLICATIONS OF TREATMENT: none  FOLLOW UP COMPLIANCE: keeps appointments   PHYSICAL EXAM:  BP (!) 145/94   Pulse 75   Temp 98.7 F (37.1 C)   Ht '5\' 1"'$  (1.549 m)   Wt 142 lb 8 oz (64.6 kg)   BMI 26.93 kg/m  Lungs are clear to A&P cardiac examination essentially unremarkable with regular rate and rhythm. No dominant mass or nodularity is noted in either breast in 2 positions examined. Incision is well-healed. No axillary or supraclavicular adenopathy is appreciated. Cosmetic result is excellent.  Well-developed well-nourished patient in NAD. HEENT reveals PERLA, EOMI, discs not visualized.  Oral cavity is clear. No oral mucosal lesions are identified. Neck is clear without evidence of cervical or supraclavicular adenopathy. Lungs are clear to A&P. Cardiac examination is essentially unremarkable with regular rate and rhythm without murmur rub or thrill. Abdomen is benign with no organomegaly or masses noted. Motor sensory and DTR levels are equal and symmetric in the upper and lower extremities. Cranial nerves II through XII are grossly intact. Proprioception is intact. No peripheral adenopathy or edema is identified. No  motor or sensory levels are noted. Crude visual fields are within normal range.  RADIOLOGY RESULTS: Mammograms reviewed compatible with above-stated findings  PLAN: Present time patient is now at 2 years with no evidence of disease.  And pleased with her overall progress.  Of asked to see her back in 1 year for follow-up and then I will discontinue follow-up care.  She continues on Arimidex without side effect.  Patient is to call with any concerns.  I would like to take this opportunity to thank you for allowing me to participate in the care of your patient.Noreene Filbert, MD

## 2021-12-25 ENCOUNTER — Ambulatory Visit (INDEPENDENT_AMBULATORY_CARE_PROVIDER_SITE_OTHER): Payer: Medicaid Other | Admitting: Dermatology

## 2021-12-25 DIAGNOSIS — L409 Psoriasis, unspecified: Secondary | ICD-10-CM | POA: Diagnosis not present

## 2021-12-25 DIAGNOSIS — L720 Epidermal cyst: Secondary | ICD-10-CM

## 2021-12-25 DIAGNOSIS — L820 Inflamed seborrheic keratosis: Secondary | ICD-10-CM | POA: Diagnosis not present

## 2021-12-25 DIAGNOSIS — Z79899 Other long term (current) drug therapy: Secondary | ICD-10-CM

## 2021-12-25 MED ORDER — OTEZLA 30 MG PO TABS: 30.0000 mg | ORAL_TABLET | Freq: Two times a day (BID) | ORAL | 6 refills | Status: DC

## 2021-12-25 NOTE — Patient Instructions (Addendum)
Apply arzlo sample use pea sized amount nightly to milia at upper chest.    Topical retinoid medications like tretinoin/Retin-A, adapalene/Differin, tazarotene/Fabior, and Epiduo/Epiduo Forte can cause dryness and irritation when first started. Only apply a pea-sized amount to the entire affected area. Avoid applying it around the eyes, edges of mouth and creases at the nose. If you experience irritation, use a good moisturizer first and/or apply the medicine less often. If you are doing well with the medicine, you can increase how often you use it until you are applying every night. Be careful with sun protection while using this medication as it can make you sensitive to the sun. This medicine should not be used by pregnant women.      Due to recent changes in healthcare laws, you may see results of your pathology and/or laboratory studies on MyChart before the doctors have had a chance to review them. We understand that in some cases there may be results that are confusing or concerning to you. Please understand that not all results are received at the same time and often the doctors may need to interpret multiple results in order to provide you with the best plan of care or course of treatment. Therefore, we ask that you please give [place] 2 business days to thoroughly review all your results before contacting the office for clarification. Should we see a critical lab result, you will be contacted sooner.  Cryotherapy Aftercare  Wash gently with soap and water everyday.   Apply Vaseline and Band-Aid daily until healed.   Seborrheic Keratosis  What causes seborrheic keratoses? Seborrheic keratoses are harmless, common skin growths that first appear during adult life.  As time goes by, more growths appear.  Some people may develop a large number of them.  Seborrheic keratoses appear on both covered and uncovered body parts.  They are not caused by sunlight.  The tendency to develop seborrheic  keratoses can be inherited.  They vary in color from skin-colored to gray, brown, or even black.  They can be either smooth or have a rough, warty surface.   Seborrheic keratoses are superficial and look as if they were stuck on the skin.  Under the microscope this type of keratosis looks like layers upon layers of skin.  That is why at times the top layer may seem to fall off, but the rest of the growth remains and re-grows.    Treatment Seborrheic keratoses do not need to be treated, but can easily be removed in the office.  Seborrheic keratoses often cause symptoms when they rub on clothing or jewelry.  Lesions can be in the way of shaving.  If they become inflamed, they can cause itching, soreness, or burning.  Removal of a seborrheic keratosis can be accomplished by freezing, burning, or surgery. If any spot bleeds, scabs, or grows rapidly, please return to have it checked, as these can be an indication of a skin cancer.    If You Need Anything After Your Visit  If you have any questions or concerns for your doctor, please call our main line at [phone] and press option 4 to reach your doctor's medical assistant. If no one answers, please leave a voicemail as directed and we will return your call as soon as possible. Messages left after 4 pm will be answered the following business day.   You may also send [place] a message via [place]. We typically respond to MyChart messages within 1-2 business days.  For prescription refills,  please ask your pharmacy to contact our office. Our fax number is [phone].  If you have an urgent issue when the clinic is closed that cannot wait until the next business day, you can page your doctor at the number below.    Please note that while we do our best to be available for urgent issues outside of office hours, we are not available 24/7.   If you have an urgent issue and are unable to reach [place], you may choose to seek medical care at your doctor's office,  retail clinic, urgent care center, or emergency room.  If you have a medical emergency, please immediately call 911 or go to the emergency department.  Pager Numbers  - Dr. Nehemiah Massed: [phone]  - Dr. Laurence Ferrari: [phone]  - Dr. Nicole Kindred: [phone]  In the event of inclement weather, please call our main line at [phone] for an update on the status of any delays or closures.  Dermatology Medication Tips: Please keep the boxes that topical medications come in in order to help keep track of the instructions about where and how to use these. Pharmacies typically print the medication instructions only on the boxes and not directly on the medication tubes.   If your medication is too expensive, please contact our office at [phone] option 4 or send [place] a message through [place].   We are unable to tell what your co-pay for medications will be in advance as this is different depending on your insurance coverage. However, we may be able to find a substitute medication at lower cost or fill out paperwork to get insurance to cover a needed medication.   If a prior authorization is required to get your medication covered by your insurance company, please allow [place] 1-2 business days to complete this process.  Drug prices often vary depending on where the prescription is filled and some pharmacies may offer cheaper prices.  The website www.goodrx.com contains coupons for medications through different pharmacies. The prices here do not account for what the cost may be with help from insurance (it may be cheaper with your insurance), but the website can give you the price if you did not use any insurance.  - You can print the associated coupon and take it with your prescription to the pharmacy.  - You may also stop by our office during regular business hours and pick up a GoodRx coupon card.  - If you need your prescription sent electronically to a different pharmacy, notify our office through  [facility] or by phone at [phone] option 4.     Si Usted Necesita Algo Despus de Su Visita  Tambin puede enviarnos un mensaje a travs de Pharmacist, community. Por lo general respondemos a los mensajes de MyChart en el transcurso de 1 a 2 das hbiles.  Para renovar recetas, por favor pida a su farmacia que se ponga en contacto con nuestra oficina. Harland Dingwall de fax es [place] [phone].  Si tiene un asunto urgente cuando la clnica est cerrada y que no puede esperar hasta el siguiente da hbil, puede llamar/localizar a su doctor(a) al nmero que aparece a continuacin.   Por favor, tenga en cuenta que aunque hacemos todo lo posible para estar disponibles para asuntos urgentes fuera del horario de [place], no estamos disponibles las 24 horas del da, los 7 das de la [place].   Si tiene un problema urgente y no puede comunicarse con nosotros, puede optar por buscar atencin Warden/ranger de  su doctor(a), en una clnica privada, en un centro de atencin urgente o en una sala de emergencias.  Si tiene Engineering geologist, por favor llame inmediatamente al 911 o vaya a la sala de emergencias.  Nmeros de bper  - Dr. Nehemiah Massed: [phone]  - Dra. Moye: [phone]  - Dra. Nicole Kindred: [phone]  En caso de inclemencias del [place], por favor llame a Johnsie Kindred principal al [phone] para una actualizacin sobre el [place] de cualquier retraso o cierre.  Consejos para la medicacin en dermatologa: Por favor, guarde las cajas en las que vienen los medicamentos de uso tpico para ayudarle a seguir las instrucciones sobre dnde y cmo usarlos. Las farmacias generalmente imprimen las instrucciones del medicamento slo en las cajas y no directamente en los tubos del [place].   Si su medicamento es muy caro, por favor, pngase en contacto con Zigmund Daniel llamando al [phone] y presione la opcin 4 o envenos un mensaje a travs de Pharmacist, community.   No podemos  decirle cul ser su copago por los medicamentos por adelantado ya que esto es diferente dependiendo de la cobertura de su seguro. Sin embargo, es posible que podamos encontrar un medicamento sustituto a Electrical engineer un formulario para que el seguro cubra el medicamento que se considera necesario.   Si se requiere una autorizacin previa para que su compaa de seguros [place] su medicamento, por favor permtanos de 1 a 2 das hbiles para completar este proceso.  Los precios de los medicamentos varan con frecuencia dependiendo del Environmental consultant de dnde se surte la receta y alguna farmacias pueden ofrecer precios ms baratos.  El sitio web www.goodrx.com tiene cupones para medicamentos de Airline pilot. Los precios aqu no tienen en cuenta lo que podra costar con la ayuda del seguro (puede ser ms barato con su seguro), pero el sitio web puede darle el precio si no utiliz Research scientist (physical sciences).  - Puede imprimir el cupn correspondiente y llevarlo con su receta a la farmacia.  - Tambin puede pasar por nuestra oficina durante el horario de atencin regular y Charity fundraiser una tarjeta de cupones de GoodRx.  - Si necesita que su receta se enve electrnicamente a una farmacia diferente, informe a nuestra oficina a travs de MyChart de [place] o por telfono llamando al [phone] y presione la opcin 4.

## 2021-12-25 NOTE — Progress Notes (Signed)
Follow-Up Visit   Subjective  Cynthia Nelson is a 66 y.o. female who presents for the following: Psoriasis (Still flared at palms at hands//) and OTHER (Patient reports spot at chest upper , right forearm and itchy scalp. ). The patient has spots, moles and lesions to be evaluated, some may be new or changing and the patient has concerns that these could be cancer.  The following portions of the chart were reviewed this encounter and updated as appropriate:  Tobacco  Allergies  Meds  Problems  Med Hx  Surg Hx  Fam Hx     Review of Systems: No other skin or systemic complaints except as noted in HPI or Assessment and Plan.  Objective  Well appearing patient in no apparent distress; mood and affect are within normal limits.  A focused examination was performed including scalp, b/l legs, b/l palms, upper mid chest, left forearm. Relevant physical exam findings are noted in the Assessment and Plan.  b/l palms, scalp and legs Clear today at palms, scalp and legs  upper mid chest Smooth white papule(s).   left forearm x 1 Erythematous stuck-on, waxy papule or plaque   Assessment & Plan  Psoriasis b/l palms, scalp and legs Psoriasis is a chronic non-curable, but treatable genetic/hereditary disease that may have other systemic features affecting other organ systems such as joints (Psoriatic Arthritis). It is associated with an increased risk of inflammatory bowel disease, heart disease, non-alcoholic fatty liver disease, and depression.   Chronic and persistent condition with duration or expected duration over one year. Condition is symptomatic / bothersome to patient. Not to goal but much improved.  Patient denies any headache, diarrhea, or depression  Continue Otezla 30 mg 1 po bid  Side effects of Otezla (apremilast) include diarrhea, nausea, headache, upper respiratory infection, depression, and weight decrease (5-10%). It should only be taken by pregnant women after a  discussion regarding risks and benefits with their doctor. Goal is control of skin condition, not cure.  The use of [name] requires long term medication management, including periodic office visits.  Long term medication management.  Patient is using long term (months to years) prescription medication  to control their dermatologic condition.  These medications require periodic monitoring to evaluate for efficacy and side effects and may require periodic laboratory monitoring.   Apremilast (OTEZLA) 30 MG TABS - b/l palms, scalp and legs Take 1 tablet (30 mg total) by mouth 2 (two) times daily.  Milia upper mid chest Benign. observe Start arazlo cream (sample) apply pea sized amount qhs to affected area.  Lot [id number] Exp 12/2022 2 samples given Discussed if not resolved by cream treatment could be surgically removed  Inflamed seborrheic keratosis left forearm x 1 Symptomatic, irritating, patient would like treated. Destruction of lesion - left forearm x 1 Complexity: simple   Destruction method: cryotherapy   Informed consent: discussed and consent obtained   Timeout:  patient name, date of birth, surgical site, and procedure verified Lesion destroyed using liquid nitrogen: Yes   Region frozen until ice ball extended beyond lesion: Yes   Outcome: patient tolerated procedure well with no complications   Post-procedure details: wound care instructions given   Additional details:  Prior to procedure, discussed risks of blister formation, small wound, skin dyspigmentation, or rare scar following cryotherapy. Recommend Vaseline ointment to treated areas while healing.  Return in about 6 months (around 06/26/2022) for psoriasis tbse .  [name], CMA, am acting as scribe for Sarina Ser, MD.  Documentation: I have reviewed the above documentation for accuracy and completeness, and I agree with the above.  Sarina Ser, MD

## 2022-01-04 ENCOUNTER — Encounter: Payer: Self-pay | Admitting: Dermatology

## 2022-01-26 ENCOUNTER — Encounter: Payer: Self-pay | Admitting: Oncology

## 2022-01-28 ENCOUNTER — Other Ambulatory Visit: Payer: Self-pay

## 2022-01-28 MED ORDER — ANASTROZOLE 1 MG PO TABS: 1.0000 mg | ORAL_TABLET | Freq: Every day | ORAL | 1 refills | Status: DC

## 2022-02-09 ENCOUNTER — Telehealth: Payer: Self-pay

## 2022-02-09 ENCOUNTER — Encounter: Payer: Self-pay | Admitting: Oncology

## 2022-02-09 NOTE — Telephone Encounter (Signed)
Patient here today to provide new insurance. Patient having trouble with getting access to [facility]. Two samples provided to help patient.  LOT: [id number] EXP: 04/19/2023  Patient also provided with [employer] patient assistance application.

## 2022-03-09 ENCOUNTER — Telehealth: Payer: Self-pay

## 2022-03-09 NOTE — Telephone Encounter (Signed)
Patient walked into office today still waiting on her PA to be approved for her [name]. Samples given  [id number] EXP: 09/19/2023

## 2022-03-17 ENCOUNTER — Encounter: Payer: Self-pay | Admitting: Oncology

## 2022-04-09 ENCOUNTER — Encounter: Payer: Self-pay | Admitting: Oncology

## 2022-04-09 ENCOUNTER — Telehealth: Payer: Self-pay

## 2022-04-09 NOTE — Telephone Encounter (Signed)
Patient came in today to pick up [place] samples.   Will fax PA status to [employer]. Patient unable to afford copay.  LOT: [id number] EXP: 03/18/2024

## 2022-04-16 ENCOUNTER — Inpatient Hospital Stay: Payer: Medicare HMO

## 2022-04-16 ENCOUNTER — Inpatient Hospital Stay: Payer: Medicare HMO | Attending: Oncology

## 2022-04-16 ENCOUNTER — Inpatient Hospital Stay: Payer: Medicare HMO | Admitting: Oncology

## 2022-04-16 ENCOUNTER — Encounter: Payer: Self-pay | Admitting: Oncology

## 2022-04-16 VITALS — BP 143/100 | HR 77 | Temp 97.9°F | Resp 18 | Wt 142.2 lb

## 2022-04-16 DIAGNOSIS — C50412 Malignant neoplasm of upper-outer quadrant of left female breast: Secondary | ICD-10-CM | POA: Insufficient documentation

## 2022-04-16 DIAGNOSIS — M8580 Other specified disorders of bone density and structure, unspecified site: Secondary | ICD-10-CM

## 2022-04-16 DIAGNOSIS — Z170 Estrogen receptor positive status [ER+]: Secondary | ICD-10-CM | POA: Insufficient documentation

## 2022-04-16 DIAGNOSIS — I10 Essential (primary) hypertension: Secondary | ICD-10-CM | POA: Insufficient documentation

## 2022-04-16 DIAGNOSIS — Z79899 Other long term (current) drug therapy: Secondary | ICD-10-CM | POA: Insufficient documentation

## 2022-04-16 DIAGNOSIS — Z79811 Long term (current) use of aromatase inhibitors: Secondary | ICD-10-CM | POA: Insufficient documentation

## 2022-04-16 DIAGNOSIS — D751 Secondary polycythemia: Secondary | ICD-10-CM | POA: Diagnosis not present

## 2022-04-16 DIAGNOSIS — Z1231 Encounter for screening mammogram for malignant neoplasm of breast: Secondary | ICD-10-CM | POA: Diagnosis not present

## 2022-04-16 DIAGNOSIS — M85851 Other specified disorders of bone density and structure, right thigh: Secondary | ICD-10-CM | POA: Diagnosis present

## 2022-04-16 LAB — COMPREHENSIVE METABOLIC PANEL
ALT: 21 U/L (ref 0–44)
AST: 25 U/L (ref 15–41)
Albumin: 4.3 g/dL (ref 3.5–5.0)
Alkaline Phosphatase: 67 U/L (ref 38–126)
Anion gap: 7 (ref 5–15)
BUN: 17 mg/dL (ref 8–23)
CO2: 29 mmol/L (ref 22–32)
Calcium: 9.4 mg/dL (ref 8.9–10.3)
Chloride: 104 mmol/L (ref 98–111)
Creatinine, Ser: 0.7 mg/dL (ref 0.44–1.00)
GFR, Estimated: >60 mL/min (ref >60)
Glucose, Bld: 83 mg/dL (ref 70–99)
Potassium: 3.5 mmol/L (ref 3.5–5.1)
Sodium: 140 mmol/L (ref 135–145)
Total Bilirubin: 0.6 mg/dL (ref 0.3–1.2)
Total Protein: 7.2 g/dL (ref 6.5–8.1)

## 2022-04-16 LAB — CBC WITH DIFFERENTIAL/PLATELET
Abs Immature Granulocytes: 0.01 10*3/uL (ref 0.00–0.07)
Basophils Absolute: 0.1 10*3/uL (ref 0.0–0.1)
Basophils Relative: 1 %
Eosinophils Absolute: 0.3 10*3/uL (ref 0.0–0.5)
Eosinophils Relative: 4 %
HCT: 48.3 % — ABNORMAL HIGH (ref 36.0–46.0)
Hemoglobin: 16.3 g/dL — ABNORMAL HIGH (ref 12.0–15.0)
Immature Granulocytes: 0 %
Lymphocytes Relative: 24 %
Lymphs Abs: 1.6 10*3/uL (ref 0.7–4.0)
MCH: 32.7 pg (ref 26.0–34.0)
MCHC: 33.7 g/dL (ref 30.0–36.0)
MCV: 97 fL (ref 80.0–100.0)
Monocytes Absolute: 0.7 10*3/uL (ref 0.1–1.0)
Monocytes Relative: 11 %
Neutro Abs: 4 10*3/uL (ref 1.7–7.7)
Neutrophils Relative %: 60 %
Platelets: 269 10*3/uL (ref 150–400)
RBC: 4.98 MIL/uL (ref 3.87–5.11)
RDW: 12.4 % (ref 11.5–15.5)
WBC: 6.6 10*3/uL (ref 4.0–10.5)
nRBC: 0 % (ref 0.0–0.2)

## 2022-04-16 MED ORDER — SODIUM CHLORIDE 0.9 % IV SOLN
Freq: Once | INTRAVENOUS | Status: AC
  Filled 2022-04-16: qty 250

## 2022-04-16 MED ORDER — ZOLEDRONIC ACID 4 MG/100ML IV SOLN
4.0000 mg | Freq: Once | INTRAVENOUS | Status: AC
  Administered 2022-04-16: 4 mg via INTRAVENOUS
  Filled 2022-04-16: qty 100

## 2022-04-16 NOTE — Assessment & Plan Note (Signed)
11/30/2019 bone density showed osteopenia, right femur neck -1.9 10-year probability of fracture 10.1%, hip fracture 1.3%. Recommend calcium and vitamin D supplementation. She has had dental clearance and is on Zometa every 6 months.  

## 2022-04-16 NOTE — Assessment & Plan Note (Signed)
Recommend calcium and vitamin D supplementation.  

## 2022-04-16 NOTE — Progress Notes (Signed)
Hematology/Oncology Progress note [phone] Fax:([phone]     CHIEF COMPLAINTS/REASON FOR VISIT:   Follow-up for Stage IA left breast cancer [2021] ER+, PR+, HER2- ASSESSMENT & PLAN:   Cancer Staging  Malignant neoplasm of upper-outer quadrant of left breast in female, estrogen receptor positive ([place]) Staging form: Breast, AJCC 8th Edition - Pathologic: Stage IA (pT1c, pN0, cM0, G1, ER+, PR+, HER2-, Oncotype DX score: 16) - Signed by Earlie Server, MD on 08/22/2019   Malignant neoplasm of upper-outer quadrant of left breast in female, estrogen receptor positive ([place]) Left breast cancer, stage I pT1c pN0 invasive carcinoma, grade 2, ER+ 90%, PR 1-10% HER-2 equivocal by IHC.  Negative by FISH, Oncotype DX recurrence score is 16, no adjuvant chemotherapy.  S/p adjuvant radiation Labs reviewed and discussed with patient. Continue Arimidex 1 mg daily  Continue annual bilateral mammogram - May 2024 Breast cancer tumor markers are chronically elevated, repeat level is pending  Erythrocytosis Previous work-up showed negative JAK2 mutation with reflex.  Suspect secondary etiologies.   She declines evaluation of sleep apnea.   Osteopenia 11/30/2019 bone density showed osteopenia, right femur neck -1.9 10-year probability of fracture 10.1%, hip fracture 1.3%. Recommend calcium and vitamin D supplementation. She has had dental clearance and is on Zometa every 6 months.   Use of anastrozole (Arimidex) Recommend calcium and vitamin D supplementation.   Orders Placed This Encounter  Procedures   MM 3D SCREENING MAMMOGRAM BILATERAL BREAST    Standing Status:   Future    Standing Expiration Date:   04/16/2023    Order Specific Question:   Reason for Exam (SYMPTOM  OR DIAGNOSIS REQUIRED)    Answer:   screening mammogram    Order Specific Question:   Preferred imaging location?    Answer:   [facility]   Cancer antigen 15-3    Standing Status:   Future    Standing  Expiration Date:   04/16/2023   Cancer antigen 27.29    Standing Status:   Future    Standing Expiration Date:   04/16/2023   CBC with Differential ([place] Only)    Standing Status:   Future    Standing Expiration Date:   04/16/2023   CMP ([place] only)    Standing Status:   Future    Standing Expiration Date:   04/16/2023   Follow up in 6 months lab MD +  Zometa  All questions were answered. The patient knows to call the clinic with any problems, questions or concerns.  Earlie Server, MD, PhD [facility] Hematology Oncology 04/16/2022     HISTORY OF PRESENTING ILLNESS:  She self palpated left breast mass x 2 months.  Denies any nipple discharge, breast skin changes or breast pain. 05/29/2019 bilateral diagnostic mammogram showed a highly suspicious 1.5 cm mass involving the upper outer quadrant of the left breast, associated with architectural distortion.  Accounting for the palpable concern. Solitary abnormal left axillary lymph node with cortical thickening up to 9 mm.  Recent COVID-19 vaccination in the left arm about 4 weeks prior to the examination.  Patient underwent biopsy of the left breast mass and left axillary lymph node. 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 Grade 2, lymph node biopsy was negative for metastatic carcinoma. ER 90% PR 1 to 10%, HER-2 equivocal by IHC.  HER-2 FISH is pending.  Menarche age of 72, Patient has a history of birth control  Use for less than a year.  She is single, no children.  No previous pregnancy. Partial hysterectomy due to fibroid disease in 2005. Previous breast biopsy 2003 FNA right breast, cyst aspiration. Denies any chest radiation.  Family history of cancer, She reports a family history significant for sister deceased from ovarian cancer at age of 50, brother had kidney cancer.  Mother who was a smoker deceased from lung cancer. Grandfather and grand mother had  history of cancer.  Details not available. Genetic testing was done which showed HOXB13 and POLE VUS, no pathogenic mutation.  07/31/2019, underwent left lumpectomy with sentinel lymph node biopsy. 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  Margins were negative for both invasive carcinoma and DCIS pT1c pN0 September 2021, patient finished adjuvant radiation.  10/25/2019, patient started on Arimidex 1 mg daily.  INTERVAL HISTORY Cynthia Nelson is a 67 y.o. female who has above history reviewed by me today presents for follow up visit for management of breast cancer Patient reports tolerating Arimidex well.  Manageable side effects. She denies any new concerns today.  Denies any breast concerns.  Review of Systems  Constitutional:  Negative for appetite change, chills, fatigue and fever.  HENT:   Negative for hearing loss and voice change.   Eyes:  Negative for eye problems.  Respiratory:  Negative for chest tightness and cough.   Cardiovascular:  Negative for chest pain.  Gastrointestinal:  Negative for abdominal distention, abdominal pain, blood in stool and nausea.  Endocrine: Negative for hot flashes.  Genitourinary:  Negative for difficulty urinating and frequency.   Musculoskeletal:  Positive for arthralgias.  Skin:  Negative for itching and rash.  Neurological:  Negative for extremity weakness.  Hematological:  Negative for adenopathy.  Psychiatric/Behavioral:  Negative for confusion.     MEDICAL HISTORY:  Past Medical History:  Diagnosis Date   Cancer ([place]) 07/31/2019   Breast CA lumpectomy, radiation 09/13/19-10/12/19   Family history of kidney cancer    Family history of melanoma    Family history of ovarian cancer    Family history of prostate cancer    Hyperlipidemia    Hypertension 07/25/2019   Pt states hx  white coat. BP only elevated at MD office.   Psoriasis     SURGICAL HISTORY: Past Surgical History:  Procedure Laterality Date   ABDOMINAL HYSTERECTOMY     partial- still has ovaries   BREAST BIOPSY Left 06/01/2019   [place] bx mass at 1:00, venus marker, path pending   BREAST BIOPSY Left 06/01/2019   [place] bx of LN, coil marker, path pending   BREAST CYST ASPIRATION Right    BREAST LUMPECTOMY Left 07/2019   [facility] lumpectomy with rad no chemo   BREAST LUMPECTOMY,RADIO FREQ LOCALIZER,AXILLARY SENTINEL LYMPH NODE BIOPSY Left 07/31/2019   Procedure: BREAST LUMPECTOMY,RADIO FREQ LOCALIZER,AXILLARY SENTINEL LYMPH NODE BIOPSY;  Surgeon: Ronny Bacon, MD;  Location: [facility] ORS;  Service: General;  Laterality: Left;   PARATHYROIDECTOMY  11/22/2020   TONSILLECTOMY AND ADENOIDECTOMY      SOCIAL HISTORY: Social History   Socioeconomic History   Marital status: Single    Spouse name: Not on file   Number of children: Not on file   Years of education: Not on file   Highest education level: Not on file  Occupational History   Occupation: [employer]   Tobacco Use   Smoking status: Never   Smokeless tobacco: Never  Vaping Use   Vaping Use: Never used  Substance and Sexual Activity   Alcohol use: Never   Drug use: Never   Sexual activity: Not on file  Other Topics Concern   Not on file  Social History Narrative   Not on file   Social Determinants of Health   Financial Resource Strain: Not on file  Food Insecurity: Not on file  Transportation Needs: Not on file  Physical Activity: Not on file  Stress: Not on file  Social Connections: Not on file  Intimate Partner Violence: Not on file    FAMILY HISTORY: Family History  Problem Relation Age of Onset   Lung cancer Mother 15       smoker   Melanoma Mother 23   Ovarian cancer Sister 55   Emphysema Father    COPD Father    Prostate cancer Father    Kidney cancer Brother 40   Cancer Maternal Grandmother        possibly  due to [place] Maternal Grandfather        possibly due to tannery    Heart disease Paternal Grandmother    Hypertension Paternal Grandmother    Heart disease Brother    Heart Problems Maternal Uncle    Breast cancer Neg Hx     ALLERGIES:  is allergic to codeine.  MEDICATIONS:  Current Outpatient Medications  Medication Sig Dispense Refill   acetaminophen (TYLENOL) 500 MG tablet Take 500 mg by mouth daily as needed for moderate pain or headache.     anastrozole (ARIMIDEX) 1 MG tablet Take 1 tablet (1 mg total) by mouth daily. 90 tablet 1   Apremilast (OTEZLA) 30 MG TABS Take 1 tablet (30 mg total) by mouth 2 (two) times daily. 60 tablet 6   atorvastatin (LIPITOR) 20 MG tablet TAKE 1 TABLET BY MOUTH EVERY DAY 90 tablet 0   Chlorpheniramine-DM (CORICIDIN HBP COUGH/COLD PO) Take 1 tablet by mouth daily as needed (congestion). (Patient not taking: Reported on 06/06/2020)     No current facility-administered medications for this visit.     PHYSICAL EXAMINATION: ECOG PERFORMANCE STATUS: 1 - Symptomatic but completely ambulatory Vitals:   04/16/22 1406  BP: (!) 143/100  Pulse: 77  Resp: 18  Temp: 97.9 F (36.6 C)   Filed Weights   04/16/22 1406  Weight: 142 lb 3.2 oz (64.5 kg)    Physical Exam Constitutional:      General: She is not in acute distress. HENT:     Head: Normocephalic and atraumatic.  Eyes:     General: No scleral icterus. Cardiovascular:     Rate and Rhythm: Normal rate and regular rhythm.     Heart sounds: Normal heart sounds.  Pulmonary:     Effort: Pulmonary effort is normal. No respiratory distress.     Breath sounds: No wheezing.  Abdominal:     General: Bowel sounds are normal. There is no distension.     Palpations: Abdomen is soft.  Musculoskeletal:        General: No deformity. Normal range of motion.     Cervical back: Normal range of motion and neck supple.  Skin:    General: Skin is warm and dry.     Findings: No  erythema or rash.  Neurological:     Mental Status: She is alert. Mental status is at baseline.     Cranial Nerves: No cranial nerve deficit.     Coordination: Coordination normal.  Psychiatric:  Mood and Affect: Mood normal.   Breast exam was performed in seated and lying down position.  Left breast s/p lumpectomy, left lower outer quadrant tissue thickening, chronic.  Right breast no palpable mass.  No palpable axillary lymphadenopathy.  LABORATORY DATA:  I have reviewed the data as listed    Latest Ref Rng & Units 04/16/2022    1:52 PM 10/16/2021    1:17 PM 04/07/2021    9:33 AM  CBC  WBC 4.0 - 10.5 K/uL 6.6  6.9  6.1   Hemoglobin 12.0 - 15.0 g/dL 16.3  15.7  16.7   Hematocrit 36.0 - 46.0 % 48.3  45.0  49.0   Platelets 150 - 400 K/uL 269  256  341       Latest Ref Rng & Units 04/16/2022    1:52 PM 10/16/2021    1:17 PM 04/17/2021   12:49 PM  CMP  Glucose 70 - 99 mg/dL 83  102  92   BUN 8 - 23 mg/dL 17  15  15    Creatinine 0.44 - 1.00 mg/dL 0.70  0.70  0.74   Sodium 135 - 145 mmol/L 140  137  138   Potassium 3.5 - 5.1 mmol/L 3.5  3.8  3.8   Chloride 98 - 111 mmol/L 104  107  103   CO2 22 - 32 mmol/L 29  24  26    Calcium 8.9 - 10.3 mg/dL 9.4  9.2  9.5   Total Protein 6.5 - 8.1 g/dL 7.2  7.1    Total Bilirubin 0.3 - 1.2 mg/dL 0.6  0.4    Alkaline Phos 38 - 126 U/L 67  78    AST 15 - 41 U/L 25  28    ALT 0 - 44 U/L 21  21      RADIOGRAPHIC STUDIES: I have personally reviewed the radiological images as listed and agreed with the findings in the report. No results found.

## 2022-04-16 NOTE — Patient Instructions (Signed)

## 2022-04-16 NOTE — Assessment & Plan Note (Addendum)
Left breast cancer, stage I pT1c pN0 invasive carcinoma, grade 2, ER+ 90%, PR 1-10% HER-2 equivocal by IHC.  Negative by FISH, Oncotype DX recurrence score is 16, no adjuvant chemotherapy.  S/p adjuvant radiation Labs reviewed and discussed with patient. Continue Arimidex 1 mg daily  Continue annual bilateral mammogram - May 2024 Breast cancer tumor markers are chronically elevated, repeat level is pending

## 2022-04-16 NOTE — Assessment & Plan Note (Addendum)
Previous work-up showed negative JAK2 mutation with reflex.  Suspect secondary etiologies.   She declines evaluation of sleep apnea.

## 2022-05-06 ENCOUNTER — Telehealth: Payer: Self-pay

## 2022-05-06 NOTE — Telephone Encounter (Signed)
Patient came in today to pick up Otezla samples.   Will fax PA status to [employer]. Patient unable to afford copay.  LOT: [id number] EXP: 03/18/2024 

## 2022-06-18 ENCOUNTER — Ambulatory Visit
Admission: RE | Admit: 2022-06-18 | Discharge: 2022-06-18 | Disposition: A | Discharge status: Home / Self Care | Payer: Medicare HMO | Source: Ambulatory Visit | Attending: Oncology | Admitting: Oncology

## 2022-06-18 DIAGNOSIS — C50412 Malignant neoplasm of upper-outer quadrant of left female breast: Secondary | ICD-10-CM | POA: Diagnosis not present

## 2022-06-18 DIAGNOSIS — Z1231 Encounter for screening mammogram for malignant neoplasm of breast: Secondary | ICD-10-CM | POA: Insufficient documentation

## 2022-06-18 DIAGNOSIS — Z170 Estrogen receptor positive status [ER+]: Secondary | ICD-10-CM | POA: Insufficient documentation

## 2022-06-23 ENCOUNTER — Telehealth: Payer: Self-pay

## 2022-06-23 NOTE — Telephone Encounter (Signed)
Patient came into office and picked up additional samples of Otezla. 2 boxes given.  LOT: [id number]  EXP: 07/18/2024

## 2022-07-02 ENCOUNTER — Ambulatory Visit: Payer: Medicare HMO | Admitting: Dermatology

## 2022-07-02 VITALS — BP 155/101 | HR 83

## 2022-07-02 DIAGNOSIS — L299 Pruritus, unspecified: Secondary | ICD-10-CM

## 2022-07-02 DIAGNOSIS — Z79899 Other long term (current) drug therapy: Secondary | ICD-10-CM

## 2022-07-02 DIAGNOSIS — Z7189 Other specified counseling: Secondary | ICD-10-CM

## 2022-07-02 DIAGNOSIS — L409 Psoriasis, unspecified: Secondary | ICD-10-CM

## 2022-07-02 DIAGNOSIS — L820 Inflamed seborrheic keratosis: Secondary | ICD-10-CM | POA: Diagnosis not present

## 2022-07-02 DIAGNOSIS — L821 Other seborrheic keratosis: Secondary | ICD-10-CM

## 2022-07-02 NOTE — Patient Instructions (Signed)
Due to recent changes in healthcare laws, you may see results of your pathology and/or laboratory studies on MyChart before the doctors have had a chance to review them. We understand that in some cases there may be results that are confusing or concerning to you. Please understand that not all results are received at the same time and often the doctors may need to interpret multiple results in order to provide you with the best plan of care or course of treatment. Therefore, we ask that you please give us 2 business days to thoroughly review all your results before contacting the office for clarification. Should we see a critical lab result, you will be contacted sooner.   If You Need Anything After Your Visit  If you have any questions or concerns for your doctor, please call our main line at [phone] and press option 4 to reach your doctor's medical assistant. If no one answers, please leave a voicemail as directed and we will return your call as soon as possible. Messages left after 4 pm will be answered the following business day.   You may also send us a message via MyChart. We typically respond to MyChart messages within 1-2 business days.  For prescription refills, please ask your pharmacy to contact our office. Our fax number is [phone].  If you have an urgent issue when the clinic is closed that cannot wait until the next business day, you can page your doctor at the number below.    Please note that while we do our best to be available for urgent issues outside of office hours, we are not available 24/7.   If you have an urgent issue and are unable to reach us, you may choose to seek medical care at your doctor's office, retail clinic, urgent care center, or emergency room.  If you have a medical emergency, please immediately call 911 or go to the emergency department.  Pager Numbers  - Dr. Kowalski: [phone]  - Dr. Moye: [phone]  - Dr. Stewart:  [phone]  In the event of inclement weather, please call our main line at [phone] for an update on the status of any delays or closures.  Dermatology Medication Tips: Please keep the boxes that topical medications come in in order to help keep track of the instructions about where and how to use these. Pharmacies typically print the medication instructions only on the boxes and not directly on the medication tubes.   If your medication is too expensive, please contact our office at [phone] option 4 or send us a message through MyChart.   We are unable to tell what your co-pay for medications will be in advance as this is different depending on your insurance coverage. However, we may be able to find a substitute medication at lower cost or fill out paperwork to get insurance to cover a needed medication.   If a prior authorization is required to get your medication covered by your insurance company, please allow us 1-2 business days to complete this process.  Drug prices often vary depending on where the prescription is filled and some pharmacies may offer cheaper prices.  The website www.goodrx.com contains coupons for medications through different pharmacies. The prices here do not account for what the cost may be with help from insurance (it may be cheaper with your insurance), but the website can give you the price if you did not use any insurance.  - You can print the associated coupon and take it with   your prescription to the pharmacy.  - You may also stop by our office during regular business hours and pick up a GoodRx coupon card.  - If you need your prescription sent electronically to a different pharmacy, notify our office through [facility] MyChart or by phone at [phone] option 4.     Si Usted Necesita Algo Despus de Su Visita  Tambin puede enviarnos un mensaje a travs de MyChart. Por lo general respondemos a los mensajes de MyChart en el transcurso de 1 a 2  das hbiles.  Para renovar recetas, por favor pida a su farmacia que se ponga en contacto con nuestra oficina. Nuestro nmero de fax es el [phone].  Si tiene un asunto urgente cuando la clnica est cerrada y que no puede esperar hasta el siguiente da hbil, puede llamar/localizar a su doctor(a) al nmero que aparece a continuacin.   Por favor, tenga en cuenta que aunque hacemos todo lo posible para estar disponibles para asuntos urgentes fuera del horario de oficina, no estamos disponibles las 24 horas del da, los 7 das de la semana.   Si tiene un problema urgente y no puede comunicarse con nosotros, puede optar por buscar atencin mdica  en el consultorio de su doctor(a), en una clnica privada, en un centro de atencin urgente o en una sala de emergencias.  Si tiene una emergencia mdica, por favor llame inmediatamente al 911 o vaya a la sala de emergencias.  Nmeros de bper  - Dr. Kowalski: [phone]  - Dra. Moye: [phone]  - Dra. Stewart: [phone]  En caso de inclemencias del tiempo, por favor llame a nuestra lnea principal al [phone] para una actualizacin sobre el estado de cualquier retraso o cierre.  Consejos para la medicacin en dermatologa: Por favor, guarde las cajas en las que vienen los medicamentos de uso tpico para ayudarle a seguir las instrucciones sobre dnde y cmo usarlos. Las farmacias generalmente imprimen las instrucciones del medicamento slo en las cajas y no directamente en los tubos del medicamento.   Si su medicamento es muy caro, por favor, pngase en contacto con nuestra oficina llamando al [phone] y presione la opcin 4 o envenos un mensaje a travs de MyChart.   No podemos decirle cul ser su copago por los medicamentos por adelantado ya que esto es diferente dependiendo de la cobertura de su seguro. Sin embargo, es posible que podamos encontrar un medicamento sustituto a menor costo o llenar un formulario para que el  seguro cubra el medicamento que se considera necesario.   Si se requiere una autorizacin previa para que su compaa de seguros cubra su medicamento, por favor permtanos de 1 a 2 das hbiles para completar este proceso.  Los precios de los medicamentos varan con frecuencia dependiendo del lugar de dnde se surte la receta y alguna farmacias pueden ofrecer precios ms baratos.  El sitio web www.goodrx.com tiene cupones para medicamentos de diferentes farmacias. Los precios aqu no tienen en cuenta lo que podra costar con la ayuda del seguro (puede ser ms barato con su seguro), pero el sitio web puede darle el precio si no utiliz ningn seguro.  - Puede imprimir el cupn correspondiente y llevarlo con su receta a la farmacia.  - Tambin puede pasar por nuestra oficina durante el horario de atencin regular y recoger una tarjeta de cupones de GoodRx.  - Si necesita que su receta se enve electrnicamente a una farmacia diferente, informe a nuestra oficina a travs de MyChart de [place]   o por telfono llamando al [phone] y presione la opcin 4.  

## 2022-07-02 NOTE — Progress Notes (Signed)
   Follow-Up Visit   Subjective  Cynthia Nelson is a 67 y.o. female who presents for the following: Psoriasis - currently doing well on Otezla 30 mg po BID, no s/e of mood changes or depression, GI upsets, or headaches. Irregular skin lesion on the R forearm.  The patient has spots, moles and lesions to be evaluated, some may be new or changing and the patient may have concern these could be cancer.  The following portions of the chart were reviewed this encounter and updated as appropriate: medications, allergies, medical history  Review of Systems:  No other skin or systemic complaints except as noted in HPI or Assessment and Plan.  Objective  Well appearing patient in no apparent distress; mood and affect are within normal limits. Areas Examined: The face, scalp, and legs Relevant exam findings are noted in the Assessment and Plan.  R forearm x 1 Erythematous stuck-on, waxy papule or plaque   Assessment & Plan   Inflamed seborrheic keratosis R forearm x 1  Destruction of lesion - R forearm x 1 Complexity: simple   Destruction method: cryotherapy   Informed consent: discussed and consent obtained   Timeout:  patient name, date of birth, surgical site, and procedure verified Lesion destroyed using liquid nitrogen: Yes   Region frozen until ice ball extended beyond lesion: Yes   Outcome: patient tolerated procedure well with no complications   Post-procedure details: wound care instructions given     PSORIASIS with pruritus  Well-demarcated erythematous papules/plaques with silvery scale, guttate pink scaly papules. 1% BSA. Chronic and persistent condition with duration or expected duration over one year. Condition is symptomatic/ bothersome to patient. Not currently at goal, but doing well on treatment.   Patient denies joint pain  Treatment Plan: Continue Otezla 30 mg po BID.   Counseling on psoriasis and coordination of care  psoriasis is a chronic non-curable, but  treatable genetic/hereditary disease that may have other systemic features affecting other organ systems such as joints (Psoriatic Arthritis). It is associated with an increased risk of inflammatory bowel disease, heart disease, non-alcoholic fatty liver disease, and depression.  Treatments include light and laser treatments; topical medications; and systemic medications including oral and injectables.  SEBORRHEIC KERATOSIS - Stuck-on, waxy, tan-brown papules and/or plaques  - Benign-appearing - Discussed benign etiology and prognosis. - Observe - Call for any changes  Return in about 6 months (around 01/01/2023) for TBSE and psoriasis follow up .  Maylene Roes, CMA, am acting as scribe for Armida Sans, MD .  Documentation: I have reviewed the above documentation for accuracy and completeness, and I agree with the above.  Armida Sans, MD

## 2022-07-04 ENCOUNTER — Encounter: Payer: Self-pay | Admitting: Dermatology

## 2022-09-14 ENCOUNTER — Other Ambulatory Visit: Payer: Self-pay | Admitting: *Deleted

## 2022-09-14 MED ORDER — ANASTROZOLE 1 MG PO TABS: 1.0000 mg | ORAL_TABLET | Freq: Every day | ORAL | 1 refills | Status: DC

## 2022-10-16 ENCOUNTER — Encounter: Payer: Self-pay | Admitting: Oncology

## 2022-10-16 ENCOUNTER — Other Ambulatory Visit: Payer: Medicare HMO

## 2022-10-16 ENCOUNTER — Ambulatory Visit: Payer: Medicare HMO

## 2022-10-16 ENCOUNTER — Ambulatory Visit: Payer: Medicare HMO | Admitting: Oncology

## 2022-10-16 ENCOUNTER — Inpatient Hospital Stay (HOSPITAL_BASED_OUTPATIENT_CLINIC_OR_DEPARTMENT_OTHER): Payer: Medicare HMO | Admitting: Oncology

## 2022-10-16 ENCOUNTER — Inpatient Hospital Stay: Payer: Medicare HMO | Attending: Oncology

## 2022-10-16 ENCOUNTER — Inpatient Hospital Stay: Payer: Medicare HMO

## 2022-10-16 VITALS — BP 152/96 | HR 92 | Temp 97.1°F | Wt 147.5 lb

## 2022-10-16 DIAGNOSIS — M85851 Other specified disorders of bone density and structure, right thigh: Secondary | ICD-10-CM | POA: Diagnosis present

## 2022-10-16 DIAGNOSIS — C50412 Malignant neoplasm of upper-outer quadrant of left female breast: Secondary | ICD-10-CM

## 2022-10-16 DIAGNOSIS — D751 Secondary polycythemia: Secondary | ICD-10-CM | POA: Diagnosis not present

## 2022-10-16 DIAGNOSIS — M8580 Other specified disorders of bone density and structure, unspecified site: Secondary | ICD-10-CM

## 2022-10-16 DIAGNOSIS — Z170 Estrogen receptor positive status [ER+]: Secondary | ICD-10-CM | POA: Diagnosis not present

## 2022-10-16 DIAGNOSIS — Z79811 Long term (current) use of aromatase inhibitors: Secondary | ICD-10-CM

## 2022-10-16 DIAGNOSIS — Z79899 Other long term (current) drug therapy: Secondary | ICD-10-CM | POA: Insufficient documentation

## 2022-10-16 DIAGNOSIS — E210 Primary hyperparathyroidism: Secondary | ICD-10-CM | POA: Diagnosis not present

## 2022-10-16 LAB — CBC WITH DIFFERENTIAL (CANCER CENTER ONLY)
Abs Immature Granulocytes: 0.02 10*3/uL (ref 0.00–0.07)
Basophils Absolute: 0.1 10*3/uL (ref 0.0–0.1)
Basophils Relative: 1 %
Eosinophils Absolute: 0.3 10*3/uL (ref 0.0–0.5)
Eosinophils Relative: 5 %
HCT: 44.9 % (ref 36.0–46.0)
Hemoglobin: 15.5 g/dL — ABNORMAL HIGH (ref 12.0–15.0)
Immature Granulocytes: 0 %
Lymphocytes Relative: 20 %
Lymphs Abs: 1.3 10*3/uL (ref 0.7–4.0)
MCH: 32.8 pg (ref 26.0–34.0)
MCHC: 34.5 g/dL (ref 30.0–36.0)
MCV: 95.1 fL (ref 80.0–100.0)
Monocytes Absolute: 0.6 10*3/uL (ref 0.1–1.0)
Monocytes Relative: 9 %
Neutro Abs: 4.3 10*3/uL (ref 1.7–7.7)
Neutrophils Relative %: 65 %
Platelet Count: 290 10*3/uL (ref 150–400)
RBC: 4.72 MIL/uL (ref 3.87–5.11)
RDW: 12.3 % (ref 11.5–15.5)
WBC Count: 6.6 10*3/uL (ref 4.0–10.5)
nRBC: 0 % (ref 0.0–0.2)

## 2022-10-16 LAB — CMP (CANCER CENTER ONLY)
ALT: 20 U/L (ref 0–44)
AST: 28 U/L (ref 15–41)
Albumin: 4.2 g/dL (ref 3.5–5.0)
Alkaline Phosphatase: 75 U/L (ref 38–126)
Anion gap: 10 (ref 5–15)
BUN: 16 mg/dL (ref 8–23)
CO2: 24 mmol/L (ref 22–32)
Calcium: 10.3 mg/dL (ref 8.9–10.3)
Chloride: 102 mmol/L (ref 98–111)
Creatinine: 0.75 mg/dL (ref 0.44–1.00)
GFR, Estimated: >60 mL/min (ref >60)
Glucose, Bld: 140 mg/dL — ABNORMAL HIGH (ref 70–99)
Potassium: 3.5 mmol/L (ref 3.5–5.1)
Sodium: 136 mmol/L (ref 135–145)
Total Bilirubin: 0.6 mg/dL (ref 0.3–1.2)
Total Protein: 7 g/dL (ref 6.5–8.1)

## 2022-10-16 MED ORDER — SODIUM CHLORIDE 0.9% FLUSH
10.0000 mL | Freq: Once | INTRAVENOUS | Status: AC
  Administered 2022-10-16: 10 mL via INTRAVENOUS
  Filled 2022-10-16: qty 10

## 2022-10-16 MED ORDER — ANASTROZOLE 1 MG PO TABS: 1.0000 mg | ORAL_TABLET | Freq: Every day | ORAL | 1 refills | Status: DC

## 2022-10-16 MED ORDER — SODIUM CHLORIDE 0.9 % IV SOLN
Freq: Once | INTRAVENOUS | Status: AC
  Filled 2022-10-16: qty 250

## 2022-10-16 MED ORDER — ZOLEDRONIC ACID 4 MG/100ML IV SOLN
4.0000 mg | Freq: Once | INTRAVENOUS | Status: AC
  Administered 2022-10-16: 4 mg via INTRAVENOUS
  Filled 2022-10-16: qty 100

## 2022-10-16 NOTE — Progress Notes (Signed)
Pt here for follow up. No new concerns voiced. No new breast problems  

## 2022-10-16 NOTE — Assessment & Plan Note (Signed)
secondary to parathyroid adenoma.   S/p left inferior parathyroidectomy. Her hypercalcemia has normalized.

## 2022-10-16 NOTE — Progress Notes (Signed)
Hematology/Oncology Progress note [phone] Fax:([phone]     CHIEF COMPLAINTS/REASON FOR VISIT:   Follow-up for Stage IA left breast cancer [2021] ER+, PR+, HER2- ASSESSMENT & PLAN:   Cancer Staging  Malignant neoplasm of upper-outer quadrant of left breast in female, estrogen receptor positive (HCC) Staging form: Breast, AJCC 8th Edition - Pathologic: Stage IA (pT1c, pN0, cM0, G1, ER+, PR+, HER2-, Oncotype DX score: 16) - Signed by Rickard Patience, MD on 08/22/2019   Malignant neoplasm of upper-outer quadrant of left breast in female, estrogen receptor positive (HCC) Left breast cancer, stage I pT1c pN0 invasive carcinoma, grade 2, ER+ 90%, PR 1-10% HER-2 equivocal by IHC.  Negative by FISH, Oncotype DX recurrence score is 16, no adjuvant chemotherapy.  S/p adjuvant radiation Labs reviewed and discussed with patient. Continue Arimidex 1 mg daily, plan is to complete 5 years [ till Oct 2026] or extended endocrine therapy. Continue annual bilateral mammogram - May 2025 recent mammogram results were reviewed with patient. Breast cancer tumor markers are chronically elevated, repeat level is pending  Erythrocytosis Previous work-up showed negative JAK2 mutation with reflex.  Suspect secondary etiologies.   Hemoglobin is stable She declines evaluation of sleep apnea.   Osteopenia 11/30/2019 bone density showed osteopenia, right femur neck -1.9 10-year probability of fracture 10.1%, hip fracture 1.3%. 01/22/2022 DEXA was done at [facility].  Osteopenia. Recommend calcium and vitamin D supplementation. Continue Zometa every 6 months.   Primary hyperparathyroidism (HCC) secondary to parathyroid adenoma.   S/p left inferior parathyroidectomy. Her hypercalcemia has normalized.  Use of anastrozole (Arimidex) Recommend calcium and vitamin D supplementation.   Orders Placed This Encounter  Procedures   CBC with Differential (Cancer Center Only)    Standing Status:   Future     Standing Expiration Date:   10/16/2023   CMP (Cancer Center only)    Standing Status:   Future    Standing Expiration Date:   10/16/2023   Follow up in 6 months lab MD +  Zometa  All questions were answered. The patient knows to call the clinic with any problems, questions or concerns.  Rickard Patience, MD, PhD [facility] Hematology Oncology 10/16/2022     HISTORY OF PRESENTING ILLNESS:  She self palpated left breast mass x 2 months.  Denies any nipple discharge, breast skin changes or breast pain. 05/29/2019 bilateral diagnostic mammogram showed a highly suspicious 1.5 cm mass involving the upper outer quadrant of the left breast, associated with architectural distortion.  Accounting for the palpable concern. Solitary abnormal left axillary lymph node with cortical thickening up to 9 mm.  Recent COVID-19 vaccination in the left arm about 4 weeks prior to the examination.  Patient underwent biopsy of the left breast mass and left axillary lymph node. 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 Grade 2, lymph node biopsy was negative for metastatic carcinoma. ER 90% PR 1 to 10%, HER-2 equivocal by IHC.  HER-2 FISH is pending.  Menarche age of 57, Patient has a history of birth control  Use for less than a year. She is single, no children.  No previous pregnancy. Partial hysterectomy due to fibroid disease in 2005. Previous breast biopsy 2003 FNA right breast, cyst aspiration. Denies any chest radiation.  Family history of cancer, She reports a family history significant for sister deceased from ovarian cancer at age of 13, brother had kidney cancer.  Mother who was a smoker deceased from lung cancer. Grandfather and grand mother had  history of cancer.  Details not available. Genetic testing was done which showed HOXB13 and POLE VUS, no pathogenic mutation.  07/31/2019, underwent left lumpectomy with sentinel lymph node  biopsy. 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  Margins were negative for both invasive carcinoma and DCIS pT1c pN0 September 2021, patient finished adjuvant radiation.  10/25/2019, patient started on Arimidex 1 mg daily.  INTERVAL HISTORY Cynthia Nelson is a 67 y.o. female who has above history reviewed by me today presents for follow up visit for management of breast cancer Patient reports tolerating Arimidex well.  Manageable side effects. She denies any new concerns today.  Denies any breast concerns.  Review of Systems  Constitutional:  Negative for appetite change, chills, fatigue and fever.  HENT:   Negative for hearing loss and voice change.   Eyes:  Negative for eye problems.  Respiratory:  Negative for chest tightness and cough.   Cardiovascular:  Negative for chest pain.  Gastrointestinal:  Negative for abdominal distention, abdominal pain, blood in stool and nausea.  Endocrine: Negative for hot flashes.  Genitourinary:  Negative for difficulty urinating and frequency.   Musculoskeletal:  Positive for arthralgias.  Skin:  Negative for itching and rash.  Neurological:  Negative for extremity weakness.  Hematological:  Negative for adenopathy.  Psychiatric/Behavioral:  Negative for confusion.     MEDICAL HISTORY:  Past Medical History:  Diagnosis Date   Cancer (HCC) 07/31/2019   Breast CA lumpectomy, radiation 09/13/19-10/12/19   Family history of kidney cancer    Family history of melanoma    Family history of ovarian cancer    Family history of prostate cancer    Hyperlipidemia    Hypertension 07/25/2019   Pt states hx white coat. BP only elevated at MD office.   Psoriasis     SURGICAL HISTORY: Past Surgical History:  Procedure Laterality Date   ABDOMINAL HYSTERECTOMY     partial- still has ovaries    BREAST BIOPSY Left 06/01/2019   [place] bx mass at 1:00, venus marker, path pending   BREAST BIOPSY Left 06/01/2019   [place] bx of LN, coil marker, path pending   BREAST CYST ASPIRATION Right    BREAST LUMPECTOMY Left 07/2019   [facility] lumpectomy with rad no chemo   BREAST LUMPECTOMY,RADIO FREQ LOCALIZER,AXILLARY SENTINEL LYMPH NODE BIOPSY Left 07/31/2019   Procedure: BREAST LUMPECTOMY,RADIO FREQ LOCALIZER,AXILLARY SENTINEL LYMPH NODE BIOPSY;  Surgeon: Campbell Lerner, MD;  Location: [facility] ORS;  Service: General;  Laterality: Left;   PARATHYROIDECTOMY  11/22/2020   TONSILLECTOMY AND ADENOIDECTOMY      SOCIAL HISTORY: Social History   Socioeconomic History   Marital status: Single    Spouse name: Not on file   Number of children: Not on file   Years of education: Not on file   Highest education level: Not on file  Occupational History   Occupation: [employer]   Tobacco Use   Smoking status: Never   Smokeless tobacco: Never  Vaping Use   Vaping status: Never Used  Substance and Sexual Activity   Alcohol use: Never   Drug use: Never   Sexual activity: Not on file  Other Topics Concern   Not on file  Social History Narrative   Not on file   Social Determinants of Health   Financial Resource Strain: Not on file  Food Insecurity: Not on file  Transportation Needs: Not on file  Physical Activity: Not on file  Stress: Not on file  Social Connections: Not on file  Intimate Partner Violence: Not on file    FAMILY HISTORY: Family History  Problem Relation Age of Onset   Lung cancer Mother 1       smoker   Melanoma Mother 53   Ovarian cancer Sister 51   Emphysema Father    COPD Father    Prostate cancer Father    Kidney cancer Brother 86   Cancer Maternal Grandmother        possibly due to tannery company   Cancer Maternal Grandfather        possibly due to tannery    Heart disease Paternal Grandmother    Hypertension Paternal Grandmother    Heart disease Brother     Heart Problems Maternal Uncle    Breast cancer Neg Hx     ALLERGIES:  is allergic to codeine.  MEDICATIONS:  Current Outpatient Medications  Medication Sig Dispense Refill   acetaminophen (TYLENOL) 500 MG tablet Take 500 mg by mouth daily as needed for moderate pain or headache.     Apremilast (OTEZLA) 30 MG TABS Take 1 tablet (30 mg total) by mouth 2 (two) times daily. 60 tablet 6   atorvastatin (LIPITOR) 20 MG tablet TAKE 1 TABLET BY MOUTH EVERY DAY 90 tablet 0   fluticasone (FLONASE) 50 MCG/ACT nasal spray Place 2 sprays into both nostrils daily.     anastrozole (ARIMIDEX) 1 MG tablet Take 1 tablet (1 mg total) by mouth daily. 90 tablet 1   Chlorpheniramine-DM (CORICIDIN HBP COUGH/COLD PO) Take 1 tablet by mouth daily as needed (congestion). (Patient not taking: Reported on 06/06/2020)     No current facility-administered medications for this visit.     PHYSICAL EXAMINATION: ECOG PERFORMANCE STATUS: 1 - Symptomatic but completely ambulatory Vitals:   10/16/22 1126 10/16/22 1149  BP: (!) 146/97 (!) 152/96  Pulse: 92   Temp: (!) 97.1 F (36.2 C)    Filed Weights   10/16/22 1126  Weight: 147 lb 8 oz (66.9 kg)    Physical Exam Constitutional:      General: She is not in acute distress. HENT:     Head: Normocephalic and atraumatic.  Eyes:     General: No scleral icterus. Cardiovascular:     Rate and Rhythm: Normal rate and regular rhythm.     Heart sounds: Normal heart sounds.  Pulmonary:     Effort: Pulmonary effort is normal. No respiratory distress.     Breath sounds: No wheezing.  Abdominal:     General: Bowel sounds are normal. There is no distension.     Palpations: Abdomen is soft.  Musculoskeletal:        General: No deformity. Normal range of motion.     Cervical back: Normal range of motion and neck supple.  Skin:    General: Skin is warm and dry.     Findings: No erythema or rash.  Neurological:     Mental Status: She is alert. Mental status is at  baseline.     Cranial Nerves: No cranial nerve deficit.     Coordination: Coordination normal.  Psychiatric:        Mood and Affect: Mood normal.   Breast exam was performed in seated and lying down position.  Left breast s/p lumpectomy, left lower outer quadrant tissue thickening, chronic.  Right breast no palpable mass.  No palpable axillary lymphadenopathy.  LABORATORY  DATA:  I have reviewed the data as listed    Latest Ref Rng & Units 10/16/2022   11:10 AM 04/16/2022    1:52 PM 10/16/2021    1:17 PM  CBC  WBC 4.0 - 10.5 K/uL 6.6  6.6  6.9   Hemoglobin 12.0 - 15.0 g/dL 24.4  01.0  27.2   Hematocrit 36.0 - 46.0 % 44.9  48.3  45.0   Platelets 150 - 400 K/uL 290  269  256       Latest Ref Rng & Units 10/16/2022   11:11 AM 04/16/2022    1:52 PM 10/16/2021    1:17 PM  CMP  Glucose 70 - 99 mg/dL 536  83  644   BUN 8 - 23 mg/dL 16  17  15    Creatinine 0.44 - 1.00 mg/dL 0.34  7.42  5.95   Sodium 135 - 145 mmol/L 136  140  137   Potassium 3.5 - 5.1 mmol/L 3.5  3.5  3.8   Chloride 98 - 111 mmol/L 102  104  107   CO2 22 - 32 mmol/L 24  29  24    Calcium 8.9 - 10.3 mg/dL 63.8  9.4  9.2   Total Protein 6.5 - 8.1 g/dL 7.0  7.2  7.1   Total Bilirubin 0.3 - 1.2 mg/dL 0.6  0.6  0.4   Alkaline Phos 38 - 126 U/L 75  67  78   AST 15 - 41 U/L 28  25  28    ALT 0 - 44 U/L 20  21  21      RADIOGRAPHIC STUDIES: I have personally reviewed the radiological images as listed and agreed with the findings in the report. No results found.

## 2022-10-16 NOTE — Assessment & Plan Note (Addendum)
11/30/2019 bone density showed osteopenia, right femur neck -1.9 10-year probability of fracture 10.1%, hip fracture 1.3%. 01/22/2022 DEXA was done at [facility].  Osteopenia. Recommend calcium and vitamin D supplementation. Continue Zometa every 6 months.

## 2022-10-16 NOTE — Assessment & Plan Note (Addendum)
Left breast cancer, stage I pT1c pN0 invasive carcinoma, grade 2, ER+ 90%, PR 1-10% HER-2 equivocal by IHC.  Negative by FISH, Oncotype DX recurrence score is 16, no adjuvant chemotherapy.  S/p adjuvant radiation Labs reviewed and discussed with patient. Continue Arimidex 1 mg daily, plan is to complete 5 years [ till Oct 2026] or extended endocrine therapy. Continue annual bilateral mammogram - May 2025 recent mammogram results were reviewed with patient. Breast cancer tumor markers are chronically elevated, repeat level is pending

## 2022-10-16 NOTE — Assessment & Plan Note (Addendum)
Previous work-up showed negative JAK2 mutation with reflex.  Suspect secondary etiologies.   Hemoglobin is stable She declines evaluation of sleep apnea.

## 2022-10-16 NOTE — Assessment & Plan Note (Signed)
Recommend calcium and vitamin D supplementation.  

## 2022-10-17 LAB — CANCER ANTIGEN 15-3: CA 15-3: 36.5 U/mL — ABNORMAL HIGH (ref 0.0–25.0)

## 2022-10-17 LAB — CANCER ANTIGEN 27.29: CA 27.29: 46.2 U/mL — ABNORMAL HIGH (ref 0.0–38.6)

## 2022-11-12 ENCOUNTER — Encounter: Payer: Self-pay | Admitting: Radiation Oncology

## 2022-11-12 ENCOUNTER — Ambulatory Visit
Admission: RE | Admit: 2022-11-12 | Discharge: 2022-11-12 | Disposition: A | Discharge status: Home / Self Care | Payer: Medicare HMO | Source: Ambulatory Visit | Attending: Radiation Oncology | Admitting: Radiation Oncology

## 2022-11-12 VITALS — BP 140/83 | HR 83 | Temp 97.5°F | Resp 12 | Ht 61.0 in | Wt 145.5 lb

## 2022-11-12 DIAGNOSIS — C50412 Malignant neoplasm of upper-outer quadrant of left female breast: Secondary | ICD-10-CM | POA: Diagnosis present

## 2022-11-12 DIAGNOSIS — Z923 Personal history of irradiation: Secondary | ICD-10-CM | POA: Insufficient documentation

## 2022-11-12 DIAGNOSIS — Z170 Estrogen receptor positive status [ER+]: Secondary | ICD-10-CM | POA: Insufficient documentation

## 2022-11-12 DIAGNOSIS — Z79811 Long term (current) use of aromatase inhibitors: Secondary | ICD-10-CM | POA: Diagnosis not present

## 2022-11-12 NOTE — Progress Notes (Signed)
Radiation Oncology Follow up Note  Name: Cynthia Nelson   Date:   11/12/2022 MRN:  [id number] DOB: [birth year]    This 67 y.o. female presents to the clinic today for 3-year follow-up status post whole breast radiation for stage Ia ER/PR positive invasive mammary carcinoma.  REFERRING PROVIDER: [facility]  HPI: patient, a 67 year old with a history of stage 1A ERPR positive invasive mammary carcinoma in the left breast, is now three years post whole breast radiation. She is currently on Anastrozole, which she tolerates well without side effects. Her most recent mammograms in May were BI-RADS 2, indicating benign findings. She reports no new or ongoing problems and is doing well overall. She is also followed by her primary care physician, Dr. Bonita Quin, every six months..  COMPLICATIONS OF TREATMENT: none  FOLLOW UP COMPLIANCE: keeps appointments   PHYSICAL EXAM:  BP (!) 140/83 Comment: Patient monitors at home will see PCP if BP remanins elevated  Pulse 83   Temp (!) 97.5 F (36.4 C) (Tympanic)   Resp 12   Ht 5\' 1"  (1.549 m) Comment: stated ht  Wt 145 lb 8 oz (66 kg)   BMI 27.49 kg/m  Lungs are clear to A&P cardiac examination essentially unremarkable with regular rate and rhythm. No dominant mass or nodularity is noted in either breast in 2 positions examined. Incision is well-healed. No axillary or supraclavicular adenopathy is appreciated. Cosmetic result is excellent.  Well-developed well-nourished patient in NAD. HEENT reveals PERLA, EOMI, discs not visualized.  Oral cavity is clear. No oral mucosal lesions are identified. Neck is clear without evidence of cervical or supraclavicular adenopathy. Lungs are clear to A&P. Cardiac examination is essentially unremarkable with regular rate and rhythm without murmur rub or thrill. Abdomen is benign with no organomegaly or masses noted. Motor sensory and DTR levels are equal and symmetric in the upper and lower extremities. Cranial  nerves II through XII are grossly intact. Proprioception is intact. No peripheral adenopathy or edema is identified. No motor or sensory levels are noted. Crude visual fields are within normal range.  RADIOLOGY RESULTS: Mammograms reviewed compatible with above-stated findings  PLAN: Stage 1A ER/PR positive invasive mammary carcinoma Three years post whole breast radiation. No current issues. Mammogram in May was BI-RADS 2. Tolerating Anastrozole well. -Continue Anastrozole. -Follow-up with primary care physician, Dr. Cathie Hoops, every six months. -Return to oncology clinic as needed for any concerns.    Carmina Miller, MD

## 2022-12-31 ENCOUNTER — Encounter: Payer: Self-pay | Admitting: Dermatology

## 2022-12-31 ENCOUNTER — Ambulatory Visit: Payer: Medicare HMO | Admitting: Dermatology

## 2022-12-31 DIAGNOSIS — Z1283 Encounter for screening for malignant neoplasm of skin: Secondary | ICD-10-CM

## 2022-12-31 DIAGNOSIS — Z7189 Other specified counseling: Secondary | ICD-10-CM

## 2022-12-31 DIAGNOSIS — L409 Psoriasis, unspecified: Secondary | ICD-10-CM

## 2022-12-31 DIAGNOSIS — L814 Other melanin hyperpigmentation: Secondary | ICD-10-CM

## 2022-12-31 DIAGNOSIS — L578 Other skin changes due to chronic exposure to nonionizing radiation: Secondary | ICD-10-CM | POA: Diagnosis not present

## 2022-12-31 DIAGNOSIS — D229 Melanocytic nevi, unspecified: Secondary | ICD-10-CM | POA: Diagnosis not present

## 2022-12-31 DIAGNOSIS — Z808 Family history of malignant neoplasm of other organs or systems: Secondary | ICD-10-CM

## 2022-12-31 DIAGNOSIS — Z79899 Other long term (current) drug therapy: Secondary | ICD-10-CM

## 2022-12-31 DIAGNOSIS — X32XXXD Exposure to sunlight, subsequent encounter: Secondary | ICD-10-CM

## 2022-12-31 NOTE — Progress Notes (Signed)
   Follow-Up Visit   Subjective  Cynthia Nelson is a 67 y.o. female who presents for the following: Skin Cancer Screening and Full Body Skin Exam  The patient presents for Total-Body Skin Exam (TBSE) for skin cancer screening and mole check. The patient has spots, moles and lesions to be evaluated, some may be new or changing and the patient may have concern these could be cancer.  Fhx melanoma. Patient with psoriasis at palms, scalp and legs. Patient currently taking Otezla 30 mg twice daily, controlled per patient, occasionally has HA and nausea.   The following portions of the chart were reviewed this encounter and updated as appropriate: medications, allergies, medical history  Review of Systems:  No other skin or systemic complaints except as noted in HPI or Assessment and Plan.  Objective  Well appearing patient in no apparent distress; mood and affect are within normal limits.  A full examination was performed including scalp, head, eyes, ears, nose, lips, neck, chest, axillae, abdomen, back, buttocks, bilateral upper extremities, bilateral lower extremities, hands, feet, fingers, toes, fingernails, and toenails. All findings within normal limits unless otherwise noted below.   Relevant physical exam findings are noted in the Assessment and Plan.    Assessment & Plan   SKIN CANCER SCREENING PERFORMED TODAY.  ACTINIC DAMAGE - Chronic condition, secondary to cumulative UV/sun exposure - diffuse scaly erythematous macules with underlying dyspigmentation - Recommend daily broad spectrum sunscreen SPF 30+ to sun-exposed areas, reapply every 2 hours as needed.  - Staying in the shade or wearing long sleeves, sun glasses (UVA+UVB protection) and wide brim hats (4-inch brim around the entire circumference of the hat) are also recommended for sun protection.  - Call for new or changing lesions.  LENTIGINES, SEBORRHEIC KERATOSES, HEMANGIOMAS - Benign normal skin lesions -  Benign-appearing - Call for any changes  MELANOCYTIC NEVI - Tan-brown and/or pink-flesh-colored symmetric macules and papules - Benign appearing on exam today - Observation - Call clinic for new or changing moles - Recommend daily use of broad spectrum spf 30+ sunscreen to sun-exposed areas.   PSORIASIS Exam: Well-demarcated erythematous papules/plaques with silvery scale, guttate pink scaly papules. 0% BSA on Otezla treatment.  Chronic condition with duration or expected duration over one year. Currently well-controlled.  No depression, minimal GI upset and headache.  Psoriasis is a chronic non-curable, but treatable genetic/hereditary disease that may have other systemic features affecting other organ systems such as joints (Psoriatic Arthritis). It is associated with an increased risk of inflammatory bowel disease, heart disease, non-alcoholic fatty liver disease, and depression.  Treatments include light and laser treatments; topical medications; and systemic medications including oral and injectables.  Treatment Plan: Continue Otezla 30 mg bid.   Colon cancer screening in 2023 with colonoscopy.  Side effects of Otezla (apremilast) include diarrhea, nausea, headache, upper respiratory infection, depression, and weight decrease (5-10%). It should only be taken by pregnant women after a discussion regarding risks and benefits with their doctor. Goal is control of skin condition, not cure.  The use of [place] requires long term medication management, including periodic office visits.  FAMILY HISTORY OF SKIN CANCER What type(s): melanoma Who affected: mother  Return in about 6 months (around 07/01/2023) for with Dr. Kirtland Bouchard, Psoriasis.  Anise Salvo, RMA, am acting as scribe for Armida Sans, MD .   Documentation: I have reviewed the above documentation for accuracy and completeness, and I agree with the above.  Armida Sans, MD

## 2022-12-31 NOTE — Patient Instructions (Signed)
Side effects of Otezla (apremilast) include diarrhea, nausea, headache, upper respiratory infection, depression, and weight decrease (5-10%). It should only be taken by pregnant women after a discussion regarding risks and benefits with their doctor. Goal is control of skin condition, not cure.  The use of [place] requires long term medication management, including periodic office visits.  Melanoma ABCDEs  Melanoma is the most dangerous type of skin cancer, and is the leading cause of death from skin disease.  You are more likely to develop melanoma if you: Have light-colored skin, light-colored eyes, or red or blond hair Spend a lot of time in the sun Tan regularly, either outdoors or in a tanning bed Have had blistering sunburns, especially during childhood Have a close family member who has had a melanoma Have atypical moles or large birthmarks  Early detection of melanoma is key since treatment is typically straightforward and cure rates are extremely high if we catch it early.   The first sign of melanoma is often a change in a mole or a new dark spot.  The ABCDE system is a way of remembering the signs of melanoma.  A for asymmetry:  The two halves do not match. B for border:  The edges of the growth are irregular. C for color:  A mixture of colors are present instead of an even brown color. D for diameter:  Melanomas are usually (but not always) greater than 6mm - the size of a pencil eraser. E for evolution:  The spot keeps changing in size, shape, and color.  Please check your skin once per month between visits. You can use a small mirror in front and a large mirror behind you to keep an eye on the back side or your body.   If you see any new or changing lesions before your next follow-up, please call to schedule a visit.  Please continue daily skin protection including broad spectrum sunscreen SPF 30+ to sun-exposed areas, reapplying every 2 hours as needed when you're outdoors.     Due to recent changes in healthcare laws, you may see results of your pathology and/or laboratory studies on MyChart before the doctors have had a chance to review them. We understand that in some cases there may be results that are confusing or concerning to you. Please understand that not all results are received at the same time and often the doctors may need to interpret multiple results in order to provide you with the best plan of care or course of treatment. Therefore, we ask that you please give [place] 2 business days to thoroughly review all your results before contacting the office for clarification. Should we see a critical lab result, you will be contacted sooner.   If You Need Anything After Your Visit  If you have any questions or concerns for your doctor, please call our main line at [phone] and press option 4 to reach your doctor's medical assistant. If no one answers, please leave a voicemail as directed and we will return your call as soon as possible. Messages left after 4 pm will be answered the following business day.   You may also send [place] a message via MyChart. We typically respond to MyChart messages within 1-2 business days.  For prescription refills, please ask your pharmacy to contact our office. Our fax number is [phone].  If you have an urgent issue when the clinic is closed that cannot wait until the next business day, you can page your doctor at  the number below.    Please note that while we do our best to be available for urgent issues outside of office hours, we are not available 24/7.   If you have an urgent issue and are unable to reach [place], you may choose to seek medical care at your doctor's office, retail clinic, urgent care center, or emergency room.  If you have a medical emergency, please immediately call 911 or go to the emergency department.  Pager Numbers  - Dr. Gwen Pounds: [phone]  - Dr. Roseanne Reno: [phone]  - Dr. Katrinka Blazing: [phone]    In the event of inclement weather, please call our main line at [phone] for an update on the status of any delays or closures.  Dermatology Medication Tips: Please keep the boxes that topical medications come in in order to help keep track of the instructions about where and how to use these. Pharmacies typically print the medication instructions only on the boxes and not directly on the medication tubes.   If your medication is too expensive, please contact our office at [phone] option 4 or send [place] a message through MyChart.   We are unable to tell what your co-pay for medications will be in advance as this is different depending on your insurance coverage. However, we may be able to find a substitute medication at lower cost or fill out paperwork to get insurance to cover a needed medication.   If a prior authorization is required to get your medication covered by your insurance company, please allow [place] 1-2 business days to complete this process.  Drug prices often vary depending on where the prescription is filled and some pharmacies may offer cheaper prices.  The website www.goodrx.com contains coupons for medications through different pharmacies. The prices here do not account for what the cost may be with help from insurance (it may be cheaper with your insurance), but the website can give you the price if you did not use any insurance.  - You can print the associated coupon and take it with your prescription to the pharmacy.  - You may also stop by our office during regular business hours and pick up a GoodRx coupon card.  - If you need your prescription sent electronically to a different pharmacy, notify our office through [facility] or by phone at [phone] option 4.     Si Usted Necesita Algo Despus de Su Visita  Tambin puede enviarnos un mensaje a travs de Clinical cytogeneticist. Por lo general respondemos a los mensajes de MyChart en el transcurso de 1 a 2 das  hbiles.  Para renovar recetas, por favor pida a su farmacia que se ponga en contacto con nuestra oficina. Annie Sable de fax es [place] [phone].  Si tiene un asunto urgente cuando la clnica est cerrada y que no puede esperar hasta el siguiente da hbil, puede llamar/localizar a su doctor(a) al nmero que aparece a continuacin.   Por favor, tenga en cuenta que aunque hacemos todo lo posible para estar disponibles para asuntos urgentes fuera del horario de [place], no estamos disponibles las 24 horas del da, los 7 [address]  [address] de la [place].   Si tiene un problema urgente y no puede comunicarse con nosotros, puede optar por buscar atencin mdica  en el consultorio de su doctor(a), en una clnica privada, en un centro de atencin urgente o en una sala de emergencias.  Si tiene Engineer, drilling, por favor llame inmediatamente al 911 o vaya a la sala de emergencias.  Nmeros de bper  - Dr. Gwen Pounds: [phone]  - Dra. Roseanne Reno: [phone]  - Dr. Katrinka Blazing: [phone]   En caso de inclemencias del tiempo, por favor llame a Lacy Duverney principal al [phone] para una actualizacin sobre el [place] de cualquier retraso o cierre.  Consejos para la medicacin en dermatologa: Por favor, guarde las cajas en las que vienen los medicamentos de uso tpico para ayudarle a seguir las instrucciones sobre dnde y cmo usarlos. Las farmacias generalmente imprimen las instrucciones del medicamento slo en las cajas y no directamente en los tubos del [place].   Si su medicamento es muy caro, por favor, pngase en contacto con Rolm Gala llamando al [phone] y presione la opcin 4 o envenos un mensaje a travs de Clinical cytogeneticist.   No podemos decirle cul ser su copago por los medicamentos por adelantado ya que esto es diferente dependiendo de la cobertura de su seguro. Sin embargo, es posible que podamos encontrar un medicamento sustituto a Audiological scientist un formulario para que el  seguro cubra el medicamento que se considera necesario.   Si se requiere una autorizacin previa para que su compaa de seguros [place] su medicamento, por favor permtanos de 1 a 2 das hbiles para completar [address]  Los precios de los medicamentos varan con frecuencia dependiendo del Environmental consultant de dnde se surte la receta y alguna farmacias pueden ofrecer precios ms baratos.  El sitio web www.goodrx.com tiene cupones para medicamentos de Health and safety inspector. Los precios aqu no tienen en cuenta lo que podra costar con la ayuda del seguro (puede ser ms barato con su seguro), pero el sitio web puede darle el precio si no utiliz Tourist information centre manager.  - Puede imprimir el cupn correspondiente y llevarlo con su receta a la farmacia.  - Tambin puede pasar por nuestra oficina durante el horario de atencin regular y Education officer, museum una tarjeta de cupones de GoodRx.  - Si necesita que su receta se enve electrnicamente a una farmacia diferente, informe a nuestra oficina a travs de MyChart de [place] o por telfono llamando al [phone] y presione la opcin 4.

## 2023-02-01 ENCOUNTER — Other Ambulatory Visit: Payer: Self-pay

## 2023-02-01 DIAGNOSIS — L409 Psoriasis, unspecified: Secondary | ICD-10-CM

## 2023-02-01 MED ORDER — OTEZLA 30 MG PO TABS: 30.0000 mg | ORAL_TABLET | Freq: Two times a day (BID) | ORAL | 6 refills | Status: DC

## 2023-02-01 NOTE — Progress Notes (Signed)
 [place] requesting denial letter for [facility] for year 2025. Prescription sent to [facility] to initial insurance prior authorization.

## 2023-04-16 ENCOUNTER — Inpatient Hospital Stay: Payer: Medicare HMO

## 2023-04-16 ENCOUNTER — Inpatient Hospital Stay: Payer: Medicare HMO | Admitting: Oncology

## 2023-04-16 ENCOUNTER — Inpatient Hospital Stay: Payer: Medicare HMO | Attending: Oncology

## 2023-04-16 ENCOUNTER — Encounter: Payer: Self-pay | Admitting: Oncology

## 2023-04-16 VITALS — BP 142/92 | HR 91 | Temp 98.0°F | Resp 18 | Wt 144.9 lb

## 2023-04-16 DIAGNOSIS — Z79811 Long term (current) use of aromatase inhibitors: Secondary | ICD-10-CM | POA: Diagnosis not present

## 2023-04-16 DIAGNOSIS — Z1732 Human epidermal growth factor receptor 2 negative status: Secondary | ICD-10-CM | POA: Diagnosis not present

## 2023-04-16 DIAGNOSIS — M8580 Other specified disorders of bone density and structure, unspecified site: Secondary | ICD-10-CM

## 2023-04-16 DIAGNOSIS — Z8051 Family history of malignant neoplasm of kidney: Secondary | ICD-10-CM | POA: Insufficient documentation

## 2023-04-16 DIAGNOSIS — D751 Secondary polycythemia: Secondary | ICD-10-CM | POA: Diagnosis not present

## 2023-04-16 DIAGNOSIS — C50412 Malignant neoplasm of upper-outer quadrant of left female breast: Secondary | ICD-10-CM | POA: Insufficient documentation

## 2023-04-16 DIAGNOSIS — Z170 Estrogen receptor positive status [ER+]: Secondary | ICD-10-CM | POA: Insufficient documentation

## 2023-04-16 DIAGNOSIS — Z801 Family history of malignant neoplasm of trachea, bronchus and lung: Secondary | ICD-10-CM | POA: Insufficient documentation

## 2023-04-16 DIAGNOSIS — Z1721 Progesterone receptor positive status: Secondary | ICD-10-CM | POA: Diagnosis not present

## 2023-04-16 DIAGNOSIS — Z8041 Family history of malignant neoplasm of ovary: Secondary | ICD-10-CM | POA: Diagnosis not present

## 2023-04-16 DIAGNOSIS — M85851 Other specified disorders of bone density and structure, right thigh: Secondary | ICD-10-CM | POA: Diagnosis present

## 2023-04-16 LAB — CMP (CANCER CENTER ONLY)
ALT: 15 U/L (ref 0–44)
AST: 23 U/L (ref 15–41)
Albumin: 3.7 g/dL (ref 3.5–5.0)
Alkaline Phosphatase: 79 U/L (ref 38–126)
Anion gap: 11 (ref 5–15)
BUN: 15 mg/dL (ref 8–23)
CO2: 24 mmol/L (ref 22–32)
Calcium: 9.6 mg/dL (ref 8.9–10.3)
Chloride: 100 mmol/L (ref 98–111)
Creatinine: 0.76 mg/dL (ref 0.44–1.00)
GFR, Estimated: >60 mL/min (ref >60)
Glucose, Bld: 141 mg/dL — ABNORMAL HIGH (ref 70–99)
Potassium: 3.9 mmol/L (ref 3.5–5.1)
Sodium: 135 mmol/L (ref 135–145)
Total Bilirubin: 0.6 mg/dL (ref 0.0–1.2)
Total Protein: 7.7 g/dL (ref 6.5–8.1)

## 2023-04-16 LAB — CBC WITH DIFFERENTIAL (CANCER CENTER ONLY)
Abs Immature Granulocytes: 0.05 10*3/uL (ref 0.00–0.07)
Basophils Absolute: 0.1 10*3/uL (ref 0.0–0.1)
Basophils Relative: 1 %
Eosinophils Absolute: 0.3 10*3/uL (ref 0.0–0.5)
Eosinophils Relative: 3 %
HCT: 45.9 % (ref 36.0–46.0)
Hemoglobin: 14.8 g/dL (ref 12.0–15.0)
Immature Granulocytes: 1 %
Lymphocytes Relative: 17 %
Lymphs Abs: 1.4 10*3/uL (ref 0.7–4.0)
MCH: 30 pg (ref 26.0–34.0)
MCHC: 32.2 g/dL (ref 30.0–36.0)
MCV: 92.9 fL (ref 80.0–100.0)
Monocytes Absolute: 0.6 10*3/uL (ref 0.1–1.0)
Monocytes Relative: 8 %
Neutro Abs: 5.7 10*3/uL (ref 1.7–7.7)
Neutrophils Relative %: 70 %
Platelet Count: 336 10*3/uL (ref 150–400)
RBC: 4.94 MIL/uL (ref 3.87–5.11)
RDW: 14.1 % (ref 11.5–15.5)
WBC Count: 8.1 10*3/uL (ref 4.0–10.5)
nRBC: 0 % (ref 0.0–0.2)

## 2023-04-16 MED ORDER — SODIUM CHLORIDE 0.9 % IV SOLN
INTRAVENOUS | Status: DC
  Filled 2023-04-16: qty 250

## 2023-04-16 MED ORDER — ZOLEDRONIC ACID 4 MG/100ML IV SOLN
4.0000 mg | Freq: Once | INTRAVENOUS | Status: AC
  Administered 2023-04-16: 4 mg via INTRAVENOUS
  Filled 2023-04-16: qty 100

## 2023-04-16 NOTE — Progress Notes (Signed)
 Hematology/Oncology Progress note Telephone:([phone] Fax:([phone]) [phone]     CHIEF COMPLAINTS/REASON FOR VISIT:   Follow-up for Stage IA left breast cancer [2021] ER+, PR+, HER2- ASSESSMENT & PLAN:   Cancer Staging  Malignant neoplasm of upper-outer quadrant of left breast in female, estrogen receptor positive (HCC) Staging form: Breast, AJCC 8th Edition - Pathologic: Stage IA (pT1c, pN0, cM0, G1, ER+, PR+, HER2-, Oncotype DX score: 16) - Signed by Rickard Patience, MD on 08/22/2019   Malignant neoplasm of upper-outer quadrant of left breast in female, estrogen receptor positive (HCC) Left breast cancer, stage I pT1c pN0 invasive carcinoma, grade 2, ER+ 90%, PR 1-10% HER-2 equivocal by IHC.  Negative by FISH, Oncotype DX recurrence score is 16, no adjuvant chemotherapy.  S/p adjuvant radiation Labs reviewed and discussed with patient. Continue Arimidex 1 mg daily, plan is to complete 5 years [ till Oct 2026] or extended endocrine therapy. Continue annual bilateral mammogram - May 2025  Breast cancer tumor markers are chronically elevated  Erythrocytosis Previous work-up showed negative JAK2 mutation with reflex.  Suspect secondary etiologies.   Hemoglobin is stable She declines evaluation of sleep apnea.   Osteopenia 11/30/2019 bone density showed osteopenia, right femur neck -1.9 10-year probability of fracture 10.1%, hip fracture 1.3%. 01/22/2022 [facility].  Osteopenia.- next due Jan 2026 Recommend calcium and vitamin D supplementation. Continue Zometa every 6 months.   Orders Placed This Encounter  Procedures   MM 3D SCREENING MAMMOGRAM BILATERAL BREAST    Standing Status:   Future    Expected Date:   06/02/2023    Expiration Date:   04/15/2024    Reason for Exam (SYMPTOM  OR DIAGNOSIS REQUIRED):   screening mammo, hx breast cancer    Preferred imaging location?:   [facility]   CMP (Cancer Center only)    Standing Status:   Future    Expected Date:   10/17/2023     Expiration Date:   04/15/2024   CBC with Differential (Cancer Center Only)    Standing Status:   Future    Expected Date:   10/17/2023    Expiration Date:   04/15/2024   Follow up in 6 months lab MD +  Zometa  All questions were answered. The patient knows to call the clinic with any problems, questions or concerns.  Rickard Patience, MD, PhD [facility] Hematology Oncology 04/16/2023     HISTORY OF PRESENTING ILLNESS:  She self palpated left breast mass x 2 months.  Denies any nipple discharge, breast skin changes or breast pain. 05/29/2019 bilateral diagnostic mammogram showed a highly suspicious 1.5 cm mass involving the upper outer quadrant of the left breast, associated with architectural distortion.  Accounting for the palpable concern. Solitary abnormal left axillary lymph node with cortical thickening up to 9 mm.  Recent COVID-19 vaccination in the left arm about 4 weeks prior to the examination.  Patient underwent biopsy of the left breast mass and left axillary lymph node. 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 Grade 2, lymph node biopsy was negative for metastatic carcinoma. ER 90% PR 1 to 10%, HER-2 equivocal by IHC.  HER-2 FISH is pending.  Menarche age of 68, Patient has a history of birth control  Use for less than a year. She is single, no children.  No previous pregnancy. Partial hysterectomy due to fibroid disease in 2005. Previous breast biopsy 2003 FNA right breast, cyst aspiration. Denies any chest radiation.  Family  history of cancer, She reports a family history significant for sister deceased from ovarian cancer at age of 43, brother had kidney cancer.  Mother who was a smoker deceased from lung cancer. Grandfather and grand mother had history of cancer.  Details not available. Genetic testing was done which showed HOXB13 and POLE VUS, no pathogenic mutation.  07/31/2019, underwent left lumpectomy with  sentinel lymph node biopsy. 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  Margins were negative for both invasive carcinoma and DCIS pT1c pN0 September 2021, patient finished adjuvant radiation.  10/25/2019, patient started on Arimidex 1 mg daily.  INTERVAL HISTORY Cianna Kasparian is a 68 y.o. female who has above history reviewed by me today presents for follow up visit for management of breast cancer Patient reports tolerating Arimidex well.  Manageable side effects. She denies any new concerns today.  Denies any breast concerns.  Review of Systems  Constitutional:  Negative for appetite change, chills, fatigue and fever.  HENT:   Negative for hearing loss and voice change.   Eyes:  Negative for eye problems.  Respiratory:  Negative for chest tightness and cough.   Cardiovascular:  Negative for chest pain.  Gastrointestinal:  Negative for abdominal distention, abdominal pain, blood in stool and nausea.  Endocrine: Negative for hot flashes.  Genitourinary:  Negative for difficulty urinating and frequency.   Musculoskeletal:  Positive for arthralgias.  Skin:  Negative for itching and rash.  Neurological:  Negative for extremity weakness.  Hematological:  Negative for adenopathy.  Psychiatric/Behavioral:  Negative for confusion.     MEDICAL HISTORY:  Past Medical History:  Diagnosis Date   Cancer (HCC) 07/31/2019   Breast CA lumpectomy, radiation 09/13/19-10/12/19   Family history of kidney cancer    Family history of melanoma    Family history of ovarian cancer    Family history of prostate cancer    Hyperlipidemia    Hypertension 07/25/2019   Pt states hx white coat. BP only elevated at MD office.   Psoriasis     SURGICAL HISTORY: Past Surgical History:  Procedure Laterality Date   ABDOMINAL HYSTERECTOMY     partial-  still has ovaries   BREAST BIOPSY Left 06/01/2019   [place] bx mass at 1:00, venus marker, path pending   BREAST BIOPSY Left 06/01/2019   [place] bx of LN, coil marker, path pending   BREAST CYST ASPIRATION Right    BREAST LUMPECTOMY Left 07/2019   [facility] lumpectomy with rad no chemo   BREAST LUMPECTOMY,RADIO FREQ LOCALIZER,AXILLARY SENTINEL LYMPH NODE BIOPSY Left 07/31/2019   Procedure: BREAST LUMPECTOMY,RADIO FREQ LOCALIZER,AXILLARY SENTINEL LYMPH NODE BIOPSY;  Surgeon: Campbell Lerner, MD;  Location: [facility] ORS;  Service: General;  Laterality: Left;   PARATHYROIDECTOMY  11/22/2020   TONSILLECTOMY AND ADENOIDECTOMY      SOCIAL HISTORY: Social History   Socioeconomic History   Marital status: Single    Spouse name: Not on file   Number of children: Not on file   Years of education: Not on file   Highest education level: Not on file  Occupational History   Occupation: [employer]   Tobacco Use   Smoking status: Never   Smokeless tobacco: Never  Vaping Use   Vaping status: Never Used  Substance and Sexual Activity   Alcohol use: Never   Drug use: Never   Sexual activity: Not on file  Other Topics Concern   Not on file  Social History Narrative   Not on file   Social Drivers of Health   Financial Resource Strain: Not on file  Food Insecurity: Not on file  Transportation Needs: Not on file  Physical Activity: Not on file  Stress: Not on file  Social Connections: Not on file  Intimate Partner Violence: Not on file    FAMILY HISTORY: Family History  Problem Relation Age of Onset   Lung cancer Mother 58       smoker   Melanoma Mother 63   Ovarian cancer Sister 8   Emphysema Father    COPD Father    Prostate cancer Father    Kidney cancer Brother 45   Cancer Maternal Grandmother        possibly due to tannery company   Cancer Maternal Grandfather        possibly due to tannery    Heart disease Paternal Grandmother    Hypertension Paternal Grandmother    Heart  disease Brother    Heart Problems Maternal Uncle    Breast cancer Neg Hx     ALLERGIES:  is allergic to codeine.  MEDICATIONS:  Current Outpatient Medications  Medication Sig Dispense Refill   anastrozole (ARIMIDEX) 1 MG tablet Take 1 tablet (1 mg total) by mouth daily. 90 tablet 1   Apremilast (OTEZLA) 30 MG TABS Take 1 tablet (30 mg total) by mouth 2 (two) times daily. 60 tablet 6   atorvastatin (LIPITOR) 20 MG tablet TAKE 1 TABLET BY MOUTH EVERY DAY 90 tablet 0   fluticasone (FLONASE) 50 MCG/ACT nasal spray Place 2 sprays into both nostrils daily.     acetaminophen (TYLENOL) 500 MG tablet Take 500 mg by mouth daily as needed for moderate pain or headache. (Patient not taking: Reported on 04/16/2023)     No current facility-administered medications for this visit.     PHYSICAL EXAMINATION: ECOG PERFORMANCE STATUS: 1 - Symptomatic but completely ambulatory Vitals:   04/16/23 1032 04/16/23 1040  BP: (!) 166/88 (!) 142/92  Pulse: 91   Resp: 18   Temp: 98 F (36.7 C)    Filed Weights   04/16/23 1032  Weight: 144 lb 14.4 oz (65.7 kg)    Physical Exam Constitutional:      General: She is not in acute distress. HENT:     Head: Normocephalic and atraumatic.  Eyes:     General: No scleral icterus. Cardiovascular:     Rate and Rhythm: Normal rate and regular rhythm.     Heart sounds: Normal heart sounds.  Pulmonary:     Effort: Pulmonary effort is normal. No respiratory distress.     Breath sounds: No wheezing.  Abdominal:     General: Bowel sounds are normal. There is no distension.     Palpations: Abdomen is soft.  Musculoskeletal:        General: No deformity. Normal range of motion.     Cervical back: Normal range of motion and neck supple.  Skin:    General: Skin is warm and dry.     Findings: No erythema or rash.  Neurological:     Mental Status: She is alert. Mental status is at baseline.     Cranial Nerves: No cranial nerve deficit.     Coordination:  Coordination normal.  Psychiatric:        Mood and Affect: Mood normal.     LABORATORY DATA:  I have reviewed the data as listed  Latest Ref Rng & Units 04/16/2023   10:17 AM 10/16/2022   11:10 AM 04/16/2022    1:52 PM  CBC  WBC 4.0 - 10.5 K/uL 8.1  6.6  6.6   Hemoglobin 12.0 - 15.0 g/dL 14.7  82.9  56.2   Hematocrit 36.0 - 46.0 % 45.9  44.9  48.3   Platelets 150 - 400 K/uL 336  290  269       Latest Ref Rng & Units 04/16/2023   10:17 AM 10/16/2022   11:11 AM 04/16/2022    1:52 PM  CMP  Glucose 70 - 99 mg/dL 130  865  83   BUN 8 - 23 mg/dL 15  16  17    Creatinine 0.44 - 1.00 mg/dL 7.84  6.96  2.95   Sodium 135 - 145 mmol/L 135  136  140   Potassium 3.5 - 5.1 mmol/L 3.9  3.5  3.5   Chloride 98 - 111 mmol/L 100  102  104   CO2 22 - 32 mmol/L 24  24  29    Calcium 8.9 - 10.3 mg/dL 9.6  28.4  9.4   Total Protein 6.5 - 8.1 g/dL 7.7  7.0  7.2   Total Bilirubin 0.0 - 1.2 mg/dL 0.6  0.6  0.6   Alkaline Phos 38 - 126 U/L 79  75  67   AST 15 - 41 U/L 23  28  25    ALT 0 - 44 U/L 15  20  21      RADIOGRAPHIC STUDIES: I have personally reviewed the radiological images as listed and agreed with the findings in the report. No results found.

## 2023-04-16 NOTE — Assessment & Plan Note (Addendum)
 Left breast cancer, stage I pT1c pN0 invasive carcinoma, grade 2, ER+ 90%, PR 1-10% HER-2 equivocal by IHC.  Negative by FISH, Oncotype DX recurrence score is 16, no adjuvant chemotherapy.  S/p adjuvant radiation Labs reviewed and discussed with patient. Continue Arimidex 1 mg daily, plan is to complete 5 years [ till Oct 2026] or extended endocrine therapy. Continue annual bilateral mammogram - May 2025  Breast cancer tumor markers are chronically elevated

## 2023-04-16 NOTE — Assessment & Plan Note (Signed)
Previous work-up showed negative JAK2 mutation with reflex.  Suspect secondary etiologies.   Hemoglobin is stable She declines evaluation of sleep apnea.

## 2023-04-16 NOTE — Assessment & Plan Note (Addendum)
 11/30/2019 bone density showed osteopenia, right femur neck -1.9 10-year probability of fracture 10.1%, hip fracture 1.3%. 01/22/2022 DEXA was done at [facility].  Osteopenia.- next due Jan 2026 Recommend calcium and vitamin D supplementation. Continue Zometa every 6 months.

## 2023-04-27 ENCOUNTER — Telehealth: Payer: Self-pay

## 2023-04-27 NOTE — Telephone Encounter (Signed)
 Patient came by to check on her [facility] appeal, she will run out of [place] in 1 week,   Gave patient a sample box of [facility]  Lot [id number] Exp August 18, 2024  I Will forward this message to Marchelle Folks to check the status and we will call the patient with a update

## 2023-04-28 NOTE — Telephone Encounter (Signed)
 [facility] closed patient's case in Feb. Patient did not answer or call back to pharmacy. Copay at the time was $1800.   Left message for patient to return my call.  Patient will need to set up payment plans through Medicare or see if she is eligible for [employer] PAP for the 2025 year. aw

## 2023-04-29 NOTE — Telephone Encounter (Signed)
 Patient advised and will call [employer] to see if she is approved for PAP for the 2025 year. aw

## 2023-06-25 ENCOUNTER — Other Ambulatory Visit: Payer: Self-pay | Admitting: Oncology

## 2023-06-29 ENCOUNTER — Ambulatory Visit
Admission: RE | Admit: 2023-06-29 | Discharge: 2023-06-29 | Disposition: A | Discharge status: Home / Self Care | Source: Ambulatory Visit | Attending: Oncology | Admitting: Oncology

## 2023-06-29 DIAGNOSIS — Z170 Estrogen receptor positive status [ER+]: Secondary | ICD-10-CM | POA: Diagnosis present

## 2023-06-29 DIAGNOSIS — Z1231 Encounter for screening mammogram for malignant neoplasm of breast: Secondary | ICD-10-CM | POA: Insufficient documentation

## 2023-06-29 DIAGNOSIS — C50412 Malignant neoplasm of upper-outer quadrant of left female breast: Secondary | ICD-10-CM | POA: Insufficient documentation

## 2023-06-30 ENCOUNTER — Encounter: Payer: Self-pay | Admitting: Oncology

## 2023-07-01 ENCOUNTER — Other Ambulatory Visit: Payer: Self-pay | Admitting: Oncology

## 2023-07-01 ENCOUNTER — Ambulatory Visit: Payer: Medicare HMO | Admitting: Dermatology

## 2023-07-01 ENCOUNTER — Ambulatory Visit: Payer: Self-pay | Admitting: Oncology

## 2023-07-01 ENCOUNTER — Encounter: Payer: Self-pay | Admitting: Dermatology

## 2023-07-01 DIAGNOSIS — Z79899 Other long term (current) drug therapy: Secondary | ICD-10-CM | POA: Diagnosis not present

## 2023-07-01 DIAGNOSIS — Z7189 Other specified counseling: Secondary | ICD-10-CM

## 2023-07-01 DIAGNOSIS — R928 Other abnormal and inconclusive findings on diagnostic imaging of breast: Secondary | ICD-10-CM

## 2023-07-01 DIAGNOSIS — L409 Psoriasis, unspecified: Secondary | ICD-10-CM | POA: Diagnosis not present

## 2023-07-01 NOTE — Progress Notes (Signed)
   Follow-Up Visit   Subjective  Cynthia Nelson is a 68 y.o. female who presents for the following: 6 months f/u on Psoriasis on her hands, treating with Otezla  tablets 30 mg bid with a good response, patient no headaches, no weight loss, no depression, no dizziness, no diarrhea from taking Otezla .   The following portions of the chart were reviewed this encounter and updated as appropriate: medications, allergies, medical history  Review of Systems:  No other skin or systemic complaints except as noted in HPI or Assessment and Plan.  Objective  Well appearing patient in no apparent distress; mood and affect are within normal limits.  Areas Examined: Face, hands,arms,legs,scalp   Relevant exam findings are noted in the Assessment and Plan.   Assessment & Plan    PSORIASIS- improved on Otezla   Mild peeling on the palm of hands, mainly clear on the scalp,arms,legs   Chronic condition with duration or expected duration over one year. Currently well-controlled.  patient no headaches, no weight loss, no depression, no dizziness, no diarrhea (if she takes after a meal) from taking Otezla .  Treatment Plan: Continue Otezla  30 mg bid  Sample pack given  Lot [id number] Exp [id number]  Side effects of Otezla  (apremilast ) include diarrhea, nausea, headache, upper respiratory infection, depression, and weight decrease (5-10%). It should only be taken by pregnant women after a discussion regarding risks and benefits with their doctor. Goal is control of skin condition, not cure.  The use of Otezla  requires long term medication management, including periodic office visits.   Counseling on psoriasis and coordination of care  psoriasis is a chronic non-curable, but treatable genetic/hereditary disease that may have other systemic features affecting other organ systems such as joints (Psoriatic Arthritis). It is associated with an increased risk of inflammatory bowel disease, heart disease, non-alcoholic  fatty liver disease, and depression.  Treatments include light and laser treatments; topical medications; and systemic medications including oral and injectables.  Return in about 6 months (around 12/31/2023) for Psoriasis, TBSE .  IClara Crisp, CMA, am acting as scribe for Celine Collard, MD .   Documentation: I have reviewed the above documentation for accuracy and completeness, and I agree with the above.  Celine Collard, MD

## 2023-07-01 NOTE — Patient Instructions (Signed)

## 2023-07-02 NOTE — Telephone Encounter (Signed)
 Cynthia Nelson has entered order and will contact pt to schedule Mammo/ US .

## 2023-07-02 NOTE — Telephone Encounter (Signed)
-----   Message from Cynthia Nelson sent at 07/01/2023 10:56 PM EDT ----- Please arrange Diagnostic mammogram and possibly ultrasound of the right breast  ----- Message ----- From: Interface, Rad Results In Sent: 07/01/2023  12:52 PM EDT To: Cynthia Forbes, MD

## 2023-07-05 NOTE — Progress Notes (Signed)
 Pt scheduled for Mammogram and US  on 07/09/23.

## 2023-07-06 ENCOUNTER — Telehealth: Payer: Self-pay

## 2023-07-06 NOTE — Telephone Encounter (Signed)
 Patient advised and provided [facility] phone number to return call. aw

## 2023-07-06 NOTE — Telephone Encounter (Signed)
 Called [employer] to check on status of patient application. [employer] is trying to get in touch with patient but unsuccessful. Called patient and left VM to return my call. Aw  -Faxed updated application and copy of PA approval last week.

## 2023-07-09 ENCOUNTER — Encounter

## 2023-07-09 ENCOUNTER — Other Ambulatory Visit

## 2023-07-12 ENCOUNTER — Ambulatory Visit
Admission: RE | Admit: 2023-07-12 | Discharge: 2023-07-12 | Discharge status: Home / Self Care | Source: Ambulatory Visit | Attending: Oncology

## 2023-07-12 ENCOUNTER — Ambulatory Visit
Admission: RE | Admit: 2023-07-12 | Discharge: 2023-07-12 | Disposition: A | Discharge status: Home / Self Care | Source: Ambulatory Visit | Attending: Oncology | Admitting: Oncology

## 2023-07-12 DIAGNOSIS — R928 Other abnormal and inconclusive findings on diagnostic imaging of breast: Secondary | ICD-10-CM | POA: Insufficient documentation

## 2023-10-18 ENCOUNTER — Inpatient Hospital Stay: Admitting: Oncology

## 2023-10-18 ENCOUNTER — Inpatient Hospital Stay

## 2023-10-18 ENCOUNTER — Encounter: Payer: Self-pay | Admitting: Oncology

## 2023-10-18 ENCOUNTER — Inpatient Hospital Stay: Attending: Oncology

## 2023-10-18 VITALS — BP 140/96 | HR 84 | Temp 97.0°F | Resp 17 | Wt 144.0 lb

## 2023-10-18 DIAGNOSIS — Z1732 Human epidermal growth factor receptor 2 negative status: Secondary | ICD-10-CM | POA: Diagnosis not present

## 2023-10-18 DIAGNOSIS — Z79811 Long term (current) use of aromatase inhibitors: Secondary | ICD-10-CM | POA: Diagnosis not present

## 2023-10-18 DIAGNOSIS — C50412 Malignant neoplasm of upper-outer quadrant of left female breast: Secondary | ICD-10-CM | POA: Insufficient documentation

## 2023-10-18 DIAGNOSIS — Z170 Estrogen receptor positive status [ER+]: Secondary | ICD-10-CM | POA: Insufficient documentation

## 2023-10-18 DIAGNOSIS — M8580 Other specified disorders of bone density and structure, unspecified site: Secondary | ICD-10-CM | POA: Diagnosis not present

## 2023-10-18 DIAGNOSIS — Z1721 Progesterone receptor positive status: Secondary | ICD-10-CM | POA: Diagnosis not present

## 2023-10-18 DIAGNOSIS — M85851 Other specified disorders of bone density and structure, right thigh: Secondary | ICD-10-CM | POA: Insufficient documentation

## 2023-10-18 LAB — CMP (CANCER CENTER ONLY)
ALT: 13 U/L (ref 0–44)
AST: 26 U/L (ref 15–41)
Albumin: 3.4 g/dL — ABNORMAL LOW (ref 3.5–5.0)
Alkaline Phosphatase: 91 U/L (ref 38–126)
Anion gap: 10 (ref 5–15)
BUN: 14 mg/dL (ref 8–23)
CO2: 22 mmol/L (ref 22–32)
Calcium: 9.5 mg/dL (ref 8.9–10.3)
Chloride: 103 mmol/L (ref 98–111)
Creatinine: 0.79 mg/dL (ref 0.44–1.00)
GFR, Estimated: >60 mL/min (ref >60)
Glucose, Bld: 133 mg/dL — ABNORMAL HIGH (ref 70–99)
Potassium: 3.8 mmol/L (ref 3.5–5.1)
Sodium: 135 mmol/L (ref 135–145)
Total Bilirubin: 0.7 mg/dL (ref 0.0–1.2)
Total Protein: 7 g/dL (ref 6.5–8.1)

## 2023-10-18 LAB — CBC WITH DIFFERENTIAL (CANCER CENTER ONLY)
Abs Immature Granulocytes: 0.04 K/uL (ref 0.00–0.07)
Basophils Absolute: 0.1 K/uL (ref 0.0–0.1)
Basophils Relative: 1 %
Eosinophils Absolute: 0.3 K/uL (ref 0.0–0.5)
Eosinophils Relative: 4 %
HCT: 42.1 % (ref 36.0–46.0)
Hemoglobin: 13.8 g/dL (ref 12.0–15.0)
Immature Granulocytes: 1 %
Lymphocytes Relative: 22 %
Lymphs Abs: 1.5 K/uL (ref 0.7–4.0)
MCH: 28.6 pg (ref 26.0–34.0)
MCHC: 32.8 g/dL (ref 30.0–36.0)
MCV: 87.3 fL (ref 80.0–100.0)
Monocytes Absolute: 0.5 K/uL (ref 0.1–1.0)
Monocytes Relative: 8 %
Neutro Abs: 4.4 K/uL (ref 1.7–7.7)
Neutrophils Relative %: 64 %
Platelet Count: 346 K/uL (ref 150–400)
RBC: 4.82 MIL/uL (ref 3.87–5.11)
RDW: 15.6 % — ABNORMAL HIGH (ref 11.5–15.5)
WBC Count: 6.8 K/uL (ref 4.0–10.5)
nRBC: 0 % (ref 0.0–0.2)

## 2023-10-18 MED ORDER — ZOLEDRONIC ACID 4 MG/100ML IV SOLN
4.0000 mg | Freq: Once | INTRAVENOUS | Status: AC
  Administered 2023-10-18: 4 mg via INTRAVENOUS
  Filled 2023-10-18: qty 100

## 2023-10-18 MED ORDER — ANASTROZOLE 1 MG PO TABS: 1.0000 mg | ORAL_TABLET | Freq: Every day | ORAL | 1 refills | Status: AC

## 2023-10-18 MED ORDER — SODIUM CHLORIDE 0.9 % IV SOLN
INTRAVENOUS | Status: DC
  Filled 2023-10-18: qty 250

## 2023-10-18 NOTE — Assessment & Plan Note (Addendum)
 Left breast cancer, stage I pT1c pN0 invasive carcinoma, grade 2, ER+ 90%, PR 1-10% HER-2 equivocal by IHC.  Negative by FISH, Oncotype DX recurrence score is 16, no adjuvant chemotherapy.  S/p adjuvant radiation Labs reviewed and discussed with patient. Continue Arimidex  1 mg daily, plan is to complete 5 years [ till Oct 2026] or extended endocrine therapy. Continue annual bilateral mammogram - May 2026  Breast cancer tumor markers are chronically elevated, and of no clinic utility Shared decision was made to stop checking these levels.

## 2023-10-18 NOTE — Progress Notes (Signed)
 Hematology/Oncology Progress note Telephone:([phone] Fax:([phone]) [phone]     CHIEF COMPLAINTS/REASON FOR VISIT:   Follow-up for Stage IA left breast cancer [2021] ER+, PR+, HER2- ASSESSMENT & PLAN:   Cancer Staging  Malignant neoplasm of upper-outer quadrant of left breast in female, estrogen receptor positive (HCC) Staging form: Breast, AJCC 8th Edition - Pathologic: Stage IA (pT1c, pN0, cM0, G1, ER+, PR+, HER2-, Oncotype DX score: 16) - Signed by Babara Call, MD on 08/22/2019   Malignant neoplasm of upper-outer quadrant of left breast in female, estrogen receptor positive (HCC) Left breast cancer, stage I pT1c pN0 invasive carcinoma, grade 2, ER+ 90%, PR 1-10% HER-2 equivocal by IHC.  Negative by FISH, Oncotype DX recurrence score is 16, no adjuvant chemotherapy.  S/p adjuvant radiation Labs reviewed and discussed with patient. Continue Arimidex  1 mg daily, plan is to complete 5 years [ till Oct 2026] or extended endocrine therapy. Continue annual bilateral mammogram - May 2026  Breast cancer tumor markers are chronically elevated, and of no clinic utility Shared decision was made to stop checking these levels.   Osteopenia 11/30/2019 bone density showed osteopenia, right femur neck -1.9 10-year probability of fracture 10.1%, hip fracture 1.3%. 01/22/2022 DEXA was done at [facility].  Osteopenia.- next due Jan/Feb 2026 Recommend calcium  and vitamin D supplementation. Continue Zometa  every 6 months.   Use of anastrozole  (Arimidex ) Recommend calcium  and vitamin D supplementation.   Orders Placed This Encounter  Procedures   DG Bone Density    Standing Status:   Future    Expected Date:   04/16/2024    Expiration Date:   10/17/2024    Reason for Exam (SYMPTOM  OR DIAGNOSIS REQUIRED):   history of breast cancer, aromatase inhibitor    Preferred imaging [facility]?:    [facility]   CBC (Cancer Center Only)    Standing Status:   Future    Expected Date:   04/16/2024    Expiration  Date:   07/15/2024   CMP (Cancer Center only)    Standing Status:   Future    Expected Date:   04/16/2024    Expiration Date:   07/15/2024   Follow up in 6 months lab MD +  Zometa   All questions were answered. The patient knows to call the clinic with any problems, questions or concerns.  Call Babara, MD, PhD [facility] Hematology Oncology 10/18/2023     HISTORY OF PRESENTING ILLNESS:  She self palpated left breast mass x 2 months.  Denies any nipple discharge, breast skin changes or breast pain. 05/29/2019 bilateral diagnostic mammogram showed a highly suspicious 1.5 cm mass involving the upper outer quadrant of the left breast, associated with architectural distortion.  Accounting for the palpable concern. Solitary abnormal left axillary lymph node with cortical thickening up to 9 mm.  Recent COVID-19 vaccination in the left arm about 4 weeks prior to the examination.  Patient underwent biopsy of the left breast mass and left axillary lymph node. 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 Grade 2, lymph node biopsy was negative for metastatic carcinoma. ER 90% PR 1 to 10%, HER-2 equivocal by IHC.  HER-2 FISH is pending.  Menarche age of 10, Patient has a history of birth control  Use for less than a year. She is single, no children.  No previous pregnancy. Partial hysterectomy due to fibroid disease in 2005. Previous breast biopsy 2003 FNA right breast, cyst aspiration. Denies any chest radiation.  Family history of cancer,  She reports a family history significant for sister deceased from ovarian cancer at age of 23, brother had kidney cancer.  Mother who was a smoker deceased from lung cancer. Grandfather and grand mother had history of cancer.  Details not available. Genetic testing was done which showed HOXB13 and POLE VUS, no pathogenic mutation.  07/31/2019, underwent left lumpectomy with sentinel lymph node  biopsy. 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  Margins were negative for both invasive carcinoma and DCIS pT1c pN0 September 2021, patient finished adjuvant radiation.  10/25/2019, patient started on Arimidex  1 mg daily.  INTERVAL HISTORY Cynthia Nelson is a 68 y.o. female who has above history reviewed by me today presents for follow up visit for management of breast cancer Patient reports tolerating Arimidex  well.  Manageable side effects. She denies any new concerns today.  Denies any breast concerns.  Review of Systems  Constitutional:  Negative for appetite change, chills, fatigue and fever.  HENT:   Negative for hearing loss and voice change.   Eyes:  Negative for eye problems.  Respiratory:  Negative for chest tightness and cough.   Cardiovascular:  Negative for chest pain.  Gastrointestinal:  Negative for abdominal distention, abdominal pain, blood in stool and nausea.  Endocrine: Negative for hot flashes.  Genitourinary:  Negative for difficulty urinating and frequency.   Musculoskeletal:  Positive for arthralgias.  Skin:  Negative for itching and rash.  Neurological:  Negative for extremity weakness.  Hematological:  Negative for adenopathy.  Psychiatric/Behavioral:  Negative for confusion.     MEDICAL HISTORY:  Past Medical History:  Diagnosis Date   Cancer (HCC) 07/31/2019   Breast CA lumpectomy, radiation 09/13/19-10/12/19   Family history of kidney cancer    Family history of melanoma    Family history of ovarian cancer    Family history of prostate cancer    Hyperlipidemia    Hypertension 07/25/2019   Pt states hx white coat. BP only elevated at MD office.   Psoriasis     SURGICAL HISTORY: Past Surgical History:  Procedure Laterality Date   ABDOMINAL HYSTERECTOMY     partial- still has ovaries    BREAST BIOPSY Left 06/01/2019   us  bx mass at 1:00, venus marker, path pending   BREAST BIOPSY Left 06/01/2019   us  bx of LN, coil marker, path pending   BREAST CYST ASPIRATION Right    BREAST LUMPECTOMY Left 07/2019   [facility] lumpectomy with rad no chemo   BREAST LUMPECTOMY,RADIO FREQ LOCALIZER,AXILLARY SENTINEL LYMPH NODE BIOPSY Left 07/31/2019   Procedure: BREAST LUMPECTOMY,RADIO FREQ LOCALIZER,AXILLARY SENTINEL LYMPH NODE BIOPSY;  Surgeon: Lane Shope, MD;  Location: [facility] ORS;  Service: General;  Laterality: Left;   PARATHYROIDECTOMY  11/22/2020   TONSILLECTOMY AND ADENOIDECTOMY      SOCIAL HISTORY: Social History   Socioeconomic History   Marital status: Single    Spouse name: Not on file   Number of children: Not on file   Years of education: Not on file   Highest education level: Not on file  Occupational History   Occupation: [employer]   Tobacco Use   Smoking status: Never   Smokeless tobacco: Never  Vaping Use   Vaping status: Never Used  Substance and Sexual Activity   Alcohol use: Never   Drug use: Never   Sexual activity: Not on file  Other Topics Concern  Not on file  Social History Narrative   Not on file   Social Drivers of Health   Financial Resource Strain: Not on file  Food Insecurity: Not on file  Transportation Needs: Not on file  Physical Activity: Not on file  Stress: Not on file  Social Connections: Not on file  Intimate Partner Violence: Not on file    FAMILY HISTORY: Family History  Problem Relation Age of Onset   Lung cancer Mother 57       smoker   Melanoma Mother 33   Ovarian cancer Sister 15   Emphysema Father    COPD Father    Prostate cancer Father    Kidney cancer Brother 47   Cancer Maternal Grandmother        possibly due to tannery company   Cancer Maternal Grandfather        possibly due to tannery    Heart disease Paternal Grandmother    Hypertension Paternal Grandmother    Heart disease Brother     Heart Problems Maternal Uncle    Breast cancer Neg Hx     ALLERGIES:  is allergic to codeine.  MEDICATIONS:  Current Outpatient Medications  Medication Sig Dispense Refill   acetaminophen  (TYLENOL ) 500 MG tablet Take 500 mg by mouth daily as needed for moderate pain or headache.     Apremilast  (OTEZLA ) 30 MG TABS Take 1 tablet (30 mg total) by mouth 2 (two) times daily. 60 tablet 6   atorvastatin  (LIPITOR) 20 MG tablet TAKE 1 TABLET BY MOUTH EVERY DAY 90 tablet 0   fluticasone (FLONASE) 50 MCG/ACT nasal spray Place 2 sprays into both nostrils daily.     anastrozole  (ARIMIDEX ) 1 MG tablet Take 1 tablet (1 mg total) by mouth daily. 90 tablet 1   No current facility-administered medications for this visit.     PHYSICAL EXAMINATION: ECOG PERFORMANCE STATUS: 1 - Symptomatic but completely ambulatory Vitals:   10/18/23 1107  BP: (!) 140/96  Pulse: 84  Resp: 17  Temp: (!) 97 F (36.1 C)  SpO2: 99%   Filed Weights   10/18/23 1107  Weight: 144 lb (65.3 kg)    Physical Exam Constitutional:      General: She is not in acute distress. HENT:     Head: Normocephalic and atraumatic.  Eyes:     General: No scleral icterus. Cardiovascular:     Rate and Rhythm: Normal rate and regular rhythm.     Heart sounds: Normal heart sounds.  Pulmonary:     Effort: Pulmonary effort is normal. No respiratory distress.     Breath sounds: No wheezing.  Abdominal:     General: Bowel sounds are normal. There is no distension.     Palpations: Abdomen is soft.  Musculoskeletal:        General: No deformity. Normal range of motion.     Cervical back: Normal range of motion and neck supple.  Skin:    General: Skin is warm and dry.     Findings: No erythema or rash.  Neurological:     Mental Status: She is alert. Mental status is at baseline.     Cranial Nerves: No cranial nerve deficit.     Coordination: Coordination normal.  Psychiatric:        Mood and Affect: Mood normal.   Pt deferred  breast exam   LABORATORY DATA:  I have reviewed the data as listed    Latest Ref Rng & Units 10/18/2023   10:31  AM 04/16/2023   10:17 AM 10/16/2022   11:10 AM  CBC  WBC 4.0 - 10.5 K/uL 6.8  8.1  6.6   Hemoglobin 12.0 - 15.0 g/dL 86.1  85.1  84.4   Hematocrit 36.0 - 46.0 % 42.1  45.9  44.9   Platelets 150 - 400 K/uL 346  336  290       Latest Ref Rng & Units 10/18/2023   10:32 AM 04/16/2023   10:17 AM 10/16/2022   11:11 AM  CMP  Glucose 70 - 99 mg/dL 866  858  859   BUN 8 - 23 mg/dL 14  15  16    Creatinine 0.44 - 1.00 mg/dL 9.20  9.23  9.24   Sodium 135 - 145 mmol/L 135  135  136   Potassium 3.5 - 5.1 mmol/L 3.8  3.9  3.5   Chloride 98 - 111 mmol/L 103  100  102   CO2 22 - 32 mmol/L 22  24  24    Calcium  8.9 - 10.3 mg/dL 9.5  9.6  89.6   Total Protein 6.5 - 8.1 g/dL 7.0  7.7  7.0   Total Bilirubin 0.0 - 1.2 mg/dL 0.7  0.6  0.6   Alkaline Phos 38 - 126 U/L 91  79  75   AST 15 - 41 U/L 26  23  28    ALT 0 - 44 U/L 13  15  20      RADIOGRAPHIC STUDIES: I have personally reviewed the radiological images as listed and agreed with the findings in the report. No results found.

## 2023-10-18 NOTE — Assessment & Plan Note (Signed)
 Recommend calcium and vitamin D supplementation.

## 2023-10-18 NOTE — Assessment & Plan Note (Addendum)
 11/30/2019 bone density showed osteopenia, right femur neck -1.9 10-year probability of fracture 10.1%, hip fracture 1.3%. 01/22/2022 DEXA was done at [facility].  Osteopenia.- next due Jan/Feb 2026 Recommend calcium  and vitamin D supplementation. Continue Zometa  every 6 months.

## 2024-01-06 ENCOUNTER — Ambulatory Visit: Payer: Medicare HMO | Admitting: Dermatology

## 2024-01-06 DIAGNOSIS — L409 Psoriasis, unspecified: Secondary | ICD-10-CM | POA: Diagnosis not present

## 2024-01-06 DIAGNOSIS — L814 Other melanin hyperpigmentation: Secondary | ICD-10-CM | POA: Diagnosis not present

## 2024-01-06 DIAGNOSIS — L578 Other skin changes due to chronic exposure to nonionizing radiation: Secondary | ICD-10-CM | POA: Diagnosis not present

## 2024-01-06 DIAGNOSIS — Z808 Family history of malignant neoplasm of other organs or systems: Secondary | ICD-10-CM

## 2024-01-06 DIAGNOSIS — Z79899 Other long term (current) drug therapy: Secondary | ICD-10-CM

## 2024-01-06 DIAGNOSIS — Z7189 Other specified counseling: Secondary | ICD-10-CM

## 2024-01-06 DIAGNOSIS — Z1283 Encounter for screening for malignant neoplasm of skin: Secondary | ICD-10-CM

## 2024-01-06 DIAGNOSIS — L821 Other seborrheic keratosis: Secondary | ICD-10-CM | POA: Diagnosis not present

## 2024-01-06 DIAGNOSIS — D229 Melanocytic nevi, unspecified: Secondary | ICD-10-CM

## 2024-01-06 DIAGNOSIS — D1801 Hemangioma of skin and subcutaneous tissue: Secondary | ICD-10-CM | POA: Diagnosis not present

## 2024-01-06 DIAGNOSIS — W908XXA Exposure to other nonionizing radiation, initial encounter: Secondary | ICD-10-CM

## 2024-01-06 MED ORDER — OTEZLA 30 MG PO TABS: 30.0000 mg | ORAL_TABLET | Freq: Two times a day (BID) | ORAL | 6 refills | Status: DC

## 2024-01-06 MED ORDER — CLOBETASOL PROPIONATE 0.05 % EX SOLN: CUTANEOUS | 6 refills | Status: AC

## 2024-01-06 NOTE — Progress Notes (Signed)
 Follow-Up Visit   Subjective  Cynthia Nelson is a 68 y.o. female who presents for the following: Skin Cancer Screening and Full Body Skin Exam  The patient presents for Total-Body Skin Exam (TBSE) for skin cancer screening and mole check. The patient has spots, moles and lesions to be evaluated, some may be new or changing and the patient may have concern these could be cancer.  Fhx melanoma.   Patient with psoriasis at palms, scalp and legs. Patient currently taking Otezla  30 mg twice daily, controlled per patient except for small flare at posterior scalp. Tolerating Otezla  well.    The following portions of the chart were reviewed this encounter and updated as appropriate: medications, allergies, medical history  Review of Systems:  No other skin or systemic complaints except as noted in HPI or Assessment and Plan.  Objective  Well appearing patient in no apparent distress; mood and affect are within normal limits.  A full examination was performed including scalp, head, eyes, ears, nose, lips, neck, chest, axillae, abdomen, back, buttocks, bilateral upper extremities, bilateral lower extremities, hands, feet, fingers, toes, fingernails, and toenails. All findings within normal limits unless otherwise noted below.   Relevant physical exam findings are noted in the Assessment and Plan.    Assessment & Plan   SKIN CANCER SCREENING PERFORMED TODAY.  ACTINIC DAMAGE - Chronic condition, secondary to cumulative UV/sun exposure - diffuse scaly erythematous macules with underlying dyspigmentation - Recommend daily broad spectrum sunscreen SPF 30+ to sun-exposed areas, reapply every 2 hours as needed.  - Staying in the shade or wearing long sleeves, sun glasses (UVA+UVB protection) and wide brim hats (4-inch brim around the entire circumference of the hat) are also recommended for sun protection.  - Call for new or changing lesions.  LENTIGINES, SEBORRHEIC KERATOSES, HEMANGIOMAS -  Benign normal skin lesions - Benign-appearing - Call for any changes  MELANOCYTIC NEVI - Tan-brown and/or pink-flesh-colored symmetric macules and papules - Benign appearing on exam today - Observation - Call clinic for new or changing moles - Recommend daily use of broad spectrum spf 30+ sunscreen to sun-exposed areas.   PSORIASIS Exam: mild erythema with scale at scalp. 1% BSA on Otezla  treatment. Chronic condition with duration or expected duration over one year. Currently well-controlled. No depression, GI upset or headache. Psoriasis is a chronic non-curable, but treatable genetic/hereditary disease that may have other systemic features affecting other organ systems such as joints (Psoriatic Arthritis). It is associated with an increased risk of inflammatory bowel disease, heart disease, non-alcoholic fatty liver disease, and depression.  Treatments include light and laser treatments; topical medications; and systemic medications including oral and injectables. Treatment Plan: Continue Otezla  30 mg bid.  Start Clobetasol  solution once or twice daily to affected areas on scalp up to 3 to 5 days a week as needed for itching/scale.   Colon cancer screening in 2023 with colonoscopy.  Side effects of Otezla  (apremilast ) include diarrhea, nausea, headache, upper respiratory infection, depression, and weight decrease (5-10%). It should only be taken by pregnant women after a discussion regarding risks and benefits with their doctor. Goal is control of skin condition, not cure.  The use of Otezla  requires long term medication management, including periodic office visits.  FAMILY HISTORY OF SKIN CANCER What type(s): melanoma Who affected: mother PSORIASIS   This Visit - Apremilast  (OTEZLA ) 30 MG TABS - Take 1 tablet (30 mg total) by mouth 2 (two) times daily. Return in about 6 months (around 07/06/2024) for Psoriasis Follow  Up, TBSE in 1 year.  I, Jill Parcell, CMA, am acting as scribe  for Alm Rhyme, MD.    Documentation: I have reviewed the above documentation for accuracy and completeness, and I agree with the above.  Alm Rhyme, MD

## 2024-01-06 NOTE — Patient Instructions (Signed)
 Continue Otezla  30 mg bid.  Start Clobetasol  solution once or twice daily to affected areas on scalp as needed for itching/scale.    Side effects of Otezla  (apremilast ) include diarrhea, nausea, headache, upper respiratory infection, depression, and weight decrease (5-10%). It should only be taken by pregnant women after a discussion regarding risks and benefits with their doctor. Goal is control of skin condition, not cure.  The use of Otezla  requires long term medication management, including periodic office visits.    Recommend daily broad spectrum sunscreen SPF 30+ to sun-exposed areas, reapply every 2 hours as needed. Call for new or changing lesions.  Staying in the shade or wearing long sleeves, sun glasses (UVA+UVB protection) and wide brim hats (4-inch brim around the entire circumference of the hat) are also recommended for sun protection.     Melanoma ABCDEs  Melanoma is the most dangerous type of skin cancer, and is the leading cause of death from skin disease.  You are more likely to develop melanoma if you: Have light-colored skin, light-colored eyes, or red or blond hair Spend a lot of time in the sun Tan regularly, either outdoors or in a tanning bed Have had blistering sunburns, especially during childhood Have a close family member who has had a melanoma Have atypical moles or large birthmarks  Early detection of melanoma is key since treatment is typically straightforward and cure rates are extremely high if we catch it early.   The first sign of melanoma is often a change in a mole or a new dark spot.  The ABCDE system is a way of remembering the signs of melanoma.  A for asymmetry:  The two halves do not match. B for border:  The edges of the growth are irregular. C for color:  A mixture of colors are present instead of an even brown color. D for diameter:  Melanomas are usually (but not always) greater than 6mm - the size of a pencil eraser. E for evolution:  The  spot keeps changing in size, shape, and color.  Please check your skin once per month between visits. You can use a small mirror in front and a large mirror behind you to keep an eye on the back side or your body.   If you see any new or changing lesions before your next follow-up, please call to schedule a visit.  Please continue daily skin protection including broad spectrum sunscreen SPF 30+ to sun-exposed areas, reapplying every 2 hours as needed when you're outdoors.   Staying in the shade or wearing long sleeves, sun glasses (UVA+UVB protection) and wide brim hats (4-inch brim around the entire circumference of the hat) are also recommended for sun protection.       Due to recent changes in healthcare laws, you may see results of your pathology and/or laboratory studies on MyChart before the doctors have had a chance to review them. We understand that in some cases there may be results that are confusing or concerning to you. Please understand that not all results are received at the same time and often the doctors may need to interpret multiple results in order to provide you with the best plan of care or course of treatment. Therefore, we ask that you please give us  2 business days to thoroughly review all your results before contacting the office for clarification. Should we see a critical lab result, you will be contacted sooner.   If You Need Anything After Your Visit  If you  have any questions or concerns for your doctor, please call our main line at [phone] and press option 4 to reach your doctor's medical assistant. If no one answers, please leave a voicemail as directed and we will return your call as soon as possible. Messages left after 4 pm will be answered the following business day.   You may also send us  a message via MyChart. We typically respond to MyChart messages within 1-2 business days.  For prescription refills, please ask your pharmacy to contact our office.  Our fax number is [phone].  If you have an urgent issue when the clinic is closed that cannot wait until the next business day, you can page your doctor at the number below.    Please note that while we do our best to be available for urgent issues outside of office hours, we are not available 24/7.   If you have an urgent issue and are unable to reach us , you may choose to seek medical care at your doctor's office, retail clinic, urgent care center, or emergency room.  If you have a medical emergency, please immediately call 911 or go to the emergency department.  Pager Numbers  - Dr. Hester: [phone]  - Dr. Jackquline: [phone]  - Dr. Claudene: [phone]   - Dr. Raymund: [phone]  In the event of inclement weather, please call our main line at [phone] for an update on the status of any delays or closures.  Dermatology Medication Tips: Please keep the boxes that topical medications come in in order to help keep track of the instructions about where and how to use these. Pharmacies typically print the medication instructions only on the boxes and not directly on the medication tubes.   If your medication is too expensive, please contact our office at [phone] option 4 or send us  a message through MyChart.   We are unable to tell what your co-pay for medications will be in advance as this is different depending on your insurance coverage. However, we may be able to find a substitute medication at lower cost or fill out paperwork to get insurance to cover a needed medication.   If a prior authorization is required to get your medication covered by your insurance company, please allow us  1-2 business days to complete this process.  Drug prices often vary depending on where the prescription is filled and some pharmacies may offer cheaper prices.  The website www.goodrx.com contains coupons for medications through different pharmacies. The prices here do not account  for what the cost may be with help from insurance (it may be cheaper with your insurance), but the website can give you the price if you did not use any insurance.  - You can print the associated coupon and take it with your prescription to the pharmacy.  - You may also stop by our office during regular business hours and pick up a GoodRx coupon card.  - If you need your prescription sent electronically to a different pharmacy, notify our office through [facility] or by phone at [phone] option 4.     Si Usted Necesita Algo Despus de Su Visita  Tambin puede enviarnos un mensaje a travs de Clinical Cytogeneticist. Por lo general respondemos a los mensajes de MyChart en el transcurso de 1 a 2 das hbiles.  Para renovar recetas, por favor pida a su farmacia que se ponga en contacto con nuestra oficina. Randi lakes de fax es [place] [phone].  Si tiene un asunto urgente  cuando la clnica est cerrada y que no puede esperar hasta el siguiente da hbil, puede llamar/localizar a su doctor(a) al nmero que aparece a continuacin.   Por favor, tenga en cuenta que aunque hacemos todo lo posible para estar disponibles para asuntos urgentes fuera del horario de [place], no estamos disponibles las 24 horas del da, los 7 [address]  [address] de la [place].   Si tiene un problema urgente y no puede comunicarse con nosotros, puede optar por buscar atencin mdica  en el consultorio de su doctor(a), en una clnica privada, en un centro de atencin urgente o en una sala de emergencias.  Si tiene engineer, drilling, por favor llame inmediatamente al 911 o vaya a la sala de emergencias.  Nmeros de bper  - Dr. Hester: [phone]  - Dra. Jackquline: [phone]  - Dr. Claudene: [phone]  - Dra. Kitts: [phone]  En caso de inclemencias del [place], por favor llame a nuestra lnea principal al [phone] para una actualizacin sobre el estado de cualquier retraso o cierre.  Consejos para la medicacin en  dermatologa: Por favor, guarde las cajas en las que vienen los medicamentos de uso tpico para ayudarle a seguir las instrucciones sobre dnde y cmo usarlos. Las farmacias generalmente imprimen las instrucciones del medicamento slo en las cajas y no directamente en los tubos del [place].   Si su medicamento es muy caro, por favor, pngase en contacto con landry rieger llamando al [phone] y presione la opcin 4 o envenos un mensaje a travs de Clinical Cytogeneticist.   No podemos decirle cul ser su copago por los medicamentos por adelantado ya que esto es diferente dependiendo de la cobertura de su seguro. Sin embargo, es posible que podamos encontrar un medicamento sustituto a audiological scientist un formulario para que el seguro cubra el medicamento que se considera necesario.   Si se requiere una autorizacin previa para que su compaa de seguros cubra su medicamento, por favor permtanos de 1 a 2 das hbiles para completar este proceso.  Los precios de los medicamentos varan con frecuencia dependiendo del environmental consultant de dnde se surte la receta y alguna farmacias pueden ofrecer precios ms baratos.  El sitio web www.goodrx.com tiene cupones para medicamentos de health and safety inspector. Los precios aqu no tienen en cuenta lo que podra costar con la ayuda del seguro (puede ser ms barato con su seguro), pero el sitio web puede darle el precio si no utiliz tourist information centre manager.  - Puede imprimir el cupn correspondiente y llevarlo con su receta a la farmacia.  - Tambin puede pasar por nuestra oficina durante el horario de atencin regular y education officer, museum una tarjeta de cupones de GoodRx.  - Si necesita que su receta se enve electrnicamente a una farmacia diferente, informe a nuestra oficina a travs de MyChart de [place] o por telfono llamando al [phone] y presione la opcin 4.

## 2024-01-17 ENCOUNTER — Other Ambulatory Visit: Payer: Self-pay

## 2024-01-17 DIAGNOSIS — L409 Psoriasis, unspecified: Secondary | ICD-10-CM

## 2024-01-17 MED ORDER — OTEZLA 30 MG PO TABS: 30.0000 mg | ORAL_TABLET | Freq: Two times a day (BID) | ORAL | 6 refills | Status: AC

## 2024-01-17 NOTE — Progress Notes (Signed)
 Fax received from [employer] for Otezla  prescription to be sent to [facility].

## 2024-02-15 ENCOUNTER — Encounter: Payer: Self-pay | Admitting: Oncology

## 2024-02-17 ENCOUNTER — Telehealth: Payer: Self-pay

## 2024-02-17 NOTE — Telephone Encounter (Signed)
 Patient called to inform Alan she had been approved for patient assistance program with [employer] for Otezla .  She will begin in February.

## 2024-02-22 ENCOUNTER — Ambulatory Visit
Admission: RE | Admit: 2024-02-22 | Discharge: 2024-02-22 | Disposition: A | Source: Ambulatory Visit | Attending: Oncology

## 2024-02-22 ENCOUNTER — Encounter: Payer: Self-pay | Admitting: Oncology

## 2024-02-22 DIAGNOSIS — M8580 Other specified disorders of bone density and structure, unspecified site: Secondary | ICD-10-CM

## 2024-02-22 DIAGNOSIS — C50412 Malignant neoplasm of upper-outer quadrant of left female breast: Secondary | ICD-10-CM

## 2024-04-17 ENCOUNTER — Ambulatory Visit

## 2024-04-17 ENCOUNTER — Ambulatory Visit: Admitting: Oncology

## 2024-04-17 ENCOUNTER — Other Ambulatory Visit

## 2024-06-26 ENCOUNTER — Ambulatory Visit: Admitting: Dermatology

## 2024-12-20 ENCOUNTER — Ambulatory Visit: Admitting: Dermatology
